# Patient Record
Sex: Male | Born: 1985 | Race: Black or African American | Hispanic: No | Marital: Married | Smoking: Never smoker
Health system: Southern US, Community
[De-identification: ages and names within clinical notes are randomized; demographics above are authoritative.]

## PROBLEM LIST (undated history)

## (undated) HISTORY — PX: SYNOVIAL CYST EXCISION: SUR507

---

## 2004-06-15 ENCOUNTER — Emergency Department (HOSPITAL_COMMUNITY): Admission: EM | Admit: 2004-06-15 | Discharge: 2004-06-15 | Payer: Self-pay | Admitting: Emergency Medicine

## 2010-01-23 ENCOUNTER — Encounter: Payer: Self-pay | Admitting: Family Medicine

## 2014-03-10 ENCOUNTER — Emergency Department (HOSPITAL_COMMUNITY)
Admission: EM | Admit: 2014-03-10 | Discharge: 2014-03-10 | Disposition: A | Discharge status: Home / Self Care | Payer: Managed Care, Other (non HMO) | Attending: Emergency Medicine | Admitting: Emergency Medicine

## 2014-03-10 ENCOUNTER — Encounter (HOSPITAL_COMMUNITY): Payer: Self-pay | Admitting: *Deleted

## 2014-03-10 DIAGNOSIS — R3915 Urgency of urination: Secondary | ICD-10-CM | POA: Diagnosis not present

## 2014-03-10 DIAGNOSIS — R350 Frequency of micturition: Secondary | ICD-10-CM | POA: Insufficient documentation

## 2014-03-10 LAB — URINALYSIS, ROUTINE W REFLEX MICROSCOPIC
Bilirubin Urine: NEGATIVE
Glucose, UA: NEGATIVE mg/dL
Hgb urine dipstick: NEGATIVE
Ketones, ur: NEGATIVE mg/dL
Leukocytes, UA: NEGATIVE
Nitrite: NEGATIVE
Protein, ur: NEGATIVE mg/dL
Specific Gravity, Urine: 1.026 (ref 1.005–1.030)
Urobilinogen, UA: 0.2 mg/dL (ref 0.0–1.0)
pH: 6.5 (ref 5.0–8.0)

## 2014-03-10 LAB — CBG MONITORING, ED: Glucose-Capillary: 87 mg/dL (ref 70–99)

## 2014-03-10 NOTE — ED Notes (Signed)
One and half months ago started having frequent urination.  Pt states he has pain post void.  No penile discharge.  Pt denies thirst.

## 2014-03-10 NOTE — Discharge Instructions (Signed)

## 2014-03-10 NOTE — ED Provider Notes (Signed)
CSN: [id number]     Arrival date & time 03/10/14  1359 History   First MD Initiated Contact with Patient 03/10/14 1524     Chief Complaint  Patient presents with  . Urinary Frequency     (Consider location/radiation/quality/duration/timing/severity/associated sxs/prior Treatment) HPI Comments: Patient presents today with complaints of urinary urgency and frequency.  He states that symptoms have been present for the past 1.5 months and is worsening.  He denies dysuria, penile discharge, scrotal pain/swelling, pain with defecation, fever,chills, abdominal pain, nausea, or vomiting.  No penile lesions.  He states that he is sexually active, but only with his wife.  He has not taken anything for his symptoms prior to arrival.  No history of DM.    Patient is a 29 y.o. male presenting with frequency. The history is provided by the patient.  Urinary Frequency    History reviewed. No pertinent past medical history. History reviewed. No pertinent past surgical history. No family history on file. History  Substance Use Topics  . Smoking status: Never Smoker   . Smokeless tobacco: Not on file  . Alcohol Use: No    Review of Systems  Genitourinary: Positive for frequency.  All other systems reviewed and are negative.     Allergies  Review of patient's allergies indicates no known allergies.  Home Medications   Prior to Admission medications   Medication Sig Start Date End Date Taking? Authorizing Provider  ibuprofen (ADVIL,MOTRIN) 200 MG tablet Take 800 mg by mouth daily as needed for mild pain or moderate pain.   Yes Historical Provider, MD   BP 119/72 mmHg  Pulse 50  Temp(Src) 97.9 F (36.6 C) (Oral)  Resp 18  SpO2 99% Physical Exam  Constitutional: He appears well-developed and well-nourished.  HENT:  Head: Normocephalic and atraumatic.  Mouth/Throat: Oropharynx is clear and moist.  Neck: Normal range of motion. Neck supple.  Cardiovascular: Normal rate, regular rhythm  and normal heart sounds.   Pulmonary/Chest: Effort normal and breath sounds normal.  Abdominal: Soft. Bowel sounds are normal. He exhibits no distension and no mass. There is no tenderness. There is no rebound and no guarding.  Genitourinary: Testes normal and penis normal. Prostate is not enlarged and not tender. Right testis shows no mass, no swelling and no tenderness. Left testis shows no mass, no swelling and no tenderness. No penile erythema. No discharge found.  Musculoskeletal: Normal range of motion.  Lymphadenopathy:       Right: No inguinal adenopathy present.       Left: No inguinal adenopathy present.  Neurological: He is alert.  Skin: Skin is warm and dry.  Psychiatric: He has a normal mood and affect.  Nursing note and vitals reviewed.   ED Course  Procedures (including critical care time) Labs Review Labs Reviewed  URINALYSIS, ROUTINE W REFLEX MICROSCOPIC  CBG MONITORING, ED  GC/CHLAMYDIA PROBE AMP ()    Imaging Review No results found.   EKG Interpretation None      MDM   Final diagnoses:  None   Patient is a 29 year old male who presents today with Urinary frequency and urgency.  UA is negative for infection.  CBG is 87.  No prostate tenderness on exam.  Bladder scan ordered.  However, bladder scanner needed to be charged and patient unable to wait for it to be charged because he had to pick up his children.  No obvious signs of urinary retention on exam.  Patient denies any pain and states  that he feels like he is emptying his bladder.  Patient stable for discharge.  Patient given referral to Urology.  Return precautions given.      Santiago GladHeather Colson Barco, PA-C 03/11/14 [id number]  Derwood KaplanAnkit Nanavati, MD 03/12/14 [phone]

## 2014-03-10 NOTE — ED Notes (Signed)
Bladder scanner not available. Pt sts he does not think he is retaining any urine.

## 2014-03-11 LAB — GC/CHLAMYDIA PROBE AMP (~~LOC~~) NOT AT ARMC
Chlamydia: NEGATIVE
Neisseria Gonorrhea: NEGATIVE

## 2014-04-09 ENCOUNTER — Emergency Department (HOSPITAL_COMMUNITY)
Admission: EM | Admit: 2014-04-09 | Discharge: 2014-04-09 | Disposition: A | Discharge status: Home / Self Care | Payer: Worker's Compensation | Attending: Emergency Medicine | Admitting: Emergency Medicine

## 2014-04-09 ENCOUNTER — Encounter (HOSPITAL_COMMUNITY): Payer: Self-pay | Admitting: Emergency Medicine

## 2014-04-09 ENCOUNTER — Emergency Department (HOSPITAL_COMMUNITY): Payer: Worker's Compensation

## 2014-04-09 DIAGNOSIS — M545 Low back pain, unspecified: Secondary | ICD-10-CM

## 2014-04-09 DIAGNOSIS — R1030 Lower abdominal pain, unspecified: Secondary | ICD-10-CM

## 2014-04-09 DIAGNOSIS — R110 Nausea: Secondary | ICD-10-CM | POA: Diagnosis not present

## 2014-04-09 DIAGNOSIS — R350 Frequency of micturition: Secondary | ICD-10-CM

## 2014-04-09 LAB — URINALYSIS, ROUTINE W REFLEX MICROSCOPIC
Bilirubin Urine: NEGATIVE
Glucose, UA: NEGATIVE mg/dL
Hgb urine dipstick: NEGATIVE
Ketones, ur: NEGATIVE mg/dL
Leukocytes, UA: NEGATIVE
Nitrite: NEGATIVE
Protein, ur: NEGATIVE mg/dL
Specific Gravity, Urine: 1.014 (ref 1.005–1.030)
Urobilinogen, UA: 0.2 mg/dL (ref 0.0–1.0)
pH: 6 (ref 5.0–8.0)

## 2014-04-09 NOTE — ED Provider Notes (Signed)
CSN: [id number]     Arrival date & time 04/09/14  1055 History  This chart was scribed for a non-physician practitioner, Kathrynn Speedobyn M Alane Hanssen, PA-C working with Shon Batonourtney F Horton, MD by [place] Peace, ED Scribe. The patient was seen in TR02C/TR02C. The patient's care was started at 1:27 PM.    Chief Complaint  Patient presents with  . Back Pain      Patient is a 29 y.o. male presenting with back pain. The history is provided by the patient. No language interpreter was used.  Back Pain Associated symptoms: no fever   HPI Comments: Jeremy Hamilton is a 29 y.o. male who presents to the Emergency Department complaining of lower back pain x 3 weeks that occurred while pt was at work and went to pick up a box and states he experienced a sharp pain. 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 He reports pain radiates from lower back into his groin. He also complains of increased frequency and urgency in urination and intermittent nausea. No complaints of penile discharge, fever, or vomiting. Pt further states initially he was sent to an urgent care after incident and had x-rays and lumbar spine MRI performed that came back negative. He was given pain medication as well but denies much improvement in symptoms. Denies hx of kidney stones.   History reviewed. No pertinent past medical history. History reviewed. No pertinent past surgical history. No family history on file. History  Substance Use Topics  . Smoking status: Never Smoker   . Smokeless tobacco: Not on file  . Alcohol Use: No    Review of Systems  Constitutional: Negative for fever.  Gastrointestinal: Positive for nausea. Negative for vomiting.  Genitourinary: Positive for urgency and frequency. Negative for discharge.  Musculoskeletal: Positive for back pain.  All other systems reviewed and are negative.     Allergies  Review of patient's allergies indicates no known allergies.  Home Medications   Prior to  Admission medications   Medication Sig Start Date End Date Taking? Authorizing Provider  ibuprofen (ADVIL,MOTRIN) 200 MG tablet Take 800 mg by mouth daily as needed for mild pain or moderate pain.    Historical Provider, MD   BP 136/87 mmHg  Pulse 68  Temp(Src) 98.2 F (36.8 C) (Oral)  Resp 18  SpO2 100% Physical Exam  Constitutional: He is oriented to person, place, and time. He appears well-developed and well-nourished. No distress.  HENT:  Head: Normocephalic and atraumatic.  Mouth/Throat: Oropharynx is clear and moist.  Eyes: Conjunctivae are normal.  Neck: Normal range of motion. Neck supple. No spinous process tenderness and no muscular tenderness present.  Cardiovascular: Normal rate, regular rhythm and normal heart sounds.   Pulmonary/Chest: Effort normal and breath sounds normal. No respiratory distress.  Abdominal: Soft. Bowel sounds are normal. He exhibits no distension. There is no tenderness. Hernia confirmed negative in the right inguinal area and confirmed negative in the left inguinal area.  BL CVAT.  Genitourinary: Testes normal. Right testis shows no mass, no swelling and no tenderness. Left testis shows no mass, no swelling and no tenderness.  No hernias palpated, however when assessing for right-sided inguinal hernia, increased pain present.  Musculoskeletal: He exhibits no edema.  TTP over BL PSIS and right lumbar paraspinal muscles.  Neurological: He is alert and oriented to person, place, and time. He has normal strength.  Strength lower extremities 5/5 and equal bilateral. Sensation intact. Normal gait.  Skin: Skin is warm and  dry. No rash noted. He is not diaphoretic.  Psychiatric: He has a normal mood and affect. His behavior is normal.  Nursing note and vitals reviewed.   ED Course  Procedures (including critical care time) Labs Review Labs Reviewed  URINALYSIS, ROUTINE W REFLEX MICROSCOPIC    Imaging Review Ct Abdomen Pelvis Wo  Contrast  04/09/2014   CLINICAL DATA:  Groin pain, low back pain, works lifting heavy boxes  EXAM: CT ABDOMEN AND PELVIS WITHOUT CONTRAST  TECHNIQUE: Multidetector CT imaging of the abdomen and pelvis was performed following the standard protocol without IV contrast.  COMPARISON:  None.  FINDINGS: Lung bases are unremarkable. Sagittal images of thoracic and lumbar spine are unremarkable. No acute fractures are noted. No sacral fracture is noted.  Unenhanced liver shows no biliary ductal dilatation. No calcified gallstones are noted within gallbladder. Unenhanced pancreas, spleen and adrenal glands are unremarkable. Small accessory splenule. Unenhanced kidneys are symmetrical in size. No aortic aneurysm. No nephrolithiasis. No calcified ureteral calculi are noted bilaterally.  No small bowel obstruction. No ascites or free air. No adenopathy. No pericecal inflammation. Normal appendix. No distal colonic obstruction. No calcified calculi are noted within urinary bladder. Bilateral distal ureter is unremarkable. Prostate gland and seminal vesicles are unremarkable. Bilateral SI joints are unremarkable. No pelvic fractures are noted.  IMPRESSION: 1. No nephrolithiasis.  No hydronephrosis or hydroureter. 2. No acute fractures are noted. 3. No calcified ureteral calculi. 4. Normal appendix.  No pericecal inflammation. 5. No small bowel obstruction.   Electronically Signed   By: Natasha Mead M.D.   On: 04/09/2014 15:17     EKG Interpretation None     Medications - No data to display  1:32 PM- Treatment plan was discussed with patient who verbalizes understanding and agrees.   MDM   Final diagnoses:  Groin pain  Bilateral low back pain without sciatica  Urinary frequency   Nontoxic appearing, NAD. AFVSS. No red flags concerning patient's back pain. No s/s of central cord compression or cauda equina. Lower extremities are neurovascularly intact and patient is ambulating without difficulty. CT without contrast  obtained to evaluate for possible kidney stone given patient's symptoms, negative for any acute finding. Urinalysis negative. No testicular pain or swelling. The groin pain is reproduced only when assessing for hernia. This is possibly a groin strain from lifting. I advised him to continue taking the medications that he has at home and follow-up with his PCP. Resources given for orthopedic follow-up if no improvement. Stable for discharge. Return precautions given. Patient states understanding of treatment care plan and is agreeable.  I personally performed the services described in this documentation, which was scribed in my presence. The recorded information has been reviewed and is accurate.   Kathrynn Speed, PA-C 04/09/14 1531  Shon Baton, MD 04/09/14 [phone]

## 2014-04-09 NOTE — ED Notes (Signed)
Pt reports injuring lower back by lifting something heavy at work 2 weeks ago- for the past 5 days pain has radiated from the lower back into groin- admits to increased frequency in urination- denies incontinence.

## 2014-04-09 NOTE — Discharge Instructions (Signed)
Continue taking the medications that you have at home for your pain. Follow-up with your primary care physician. Follow-up at [facility] if your symptoms do not improve.  Back Pain, Adult Low back pain is very common. About 1 in 5 people have back pain.The cause of low back pain is rarely dangerous. The pain often gets better over time.About half of people with a sudden onset of back pain feel better in just 2 weeks. About 8 in 10 people feel better by 6 weeks.  CAUSES Some common causes of back pain include:  Strain of the muscles or ligaments supporting the spine.  Wear and tear (degeneration) of the spinal discs.  Arthritis.  Direct injury to the back. DIAGNOSIS Most of the time, the direct cause of low back pain is not known.However, back pain can be treated effectively even when the exact cause of the pain is unknown.Answering your caregiver's questions about your overall health and symptoms is one of the most accurate ways to make sure the cause of your pain is not dangerous. If your caregiver needs more information, he or she may order lab work or imaging tests (X-rays or MRIs).However, even if imaging tests show changes in your back, this usually does not require surgery. HOME CARE INSTRUCTIONS For many people, back pain returns.Since low back pain is rarely dangerous, it is often a condition that people can learn to [facility] their own.   Remain active. It is stressful on the back to sit or stand in one place. Do not sit, drive, or stand in one place for more than 30 minutes at a time. Take short walks on level surfaces as soon as pain allows.Try to increase the length of time you walk each day.  Do not stay in bed.Resting more than 1 or 2 days can delay your recovery.  Do not avoid exercise or work.Your body is made to move.It is not dangerous to be active, even though your back may hurt.Your back will likely heal faster if you return to being active before your  pain is gone.  Pay attention to your body when you bend and lift. Many people have less discomfortwhen lifting if they bend their knees, keep the load close to their bodies,and avoid twisting. Often, the most comfortable positions are those that put less stress on your recovering back.  Find a comfortable position to sleep. Use a firm mattress and lie on your side with your knees slightly bent. If you lie on your back, put a pillow under your knees.  Only take over-the-counter or prescription medicines as directed by your caregiver. Over-the-counter medicines to reduce pain and inflammation are often the most helpful.Your caregiver may prescribe muscle relaxant drugs.These medicines help dull your pain so you can more quickly return to your normal activities and healthy exercise.  Put ice on the injured area.  Put ice in a plastic bag.  Place a towel between your skin and the bag.  Leave the ice on for 15-20 minutes, 03-04 times a day for the first 2 to 3 days. After that, ice and heat may be alternated to reduce pain and spasms.  Ask your caregiver about trying back exercises and gentle massage. This may be of some benefit.  Avoid feeling anxious or stressed.Stress increases muscle tension and can worsen back pain.It is important to recognize when you are anxious or stressed and learn ways to manage it.Exercise is a great option. SEEK MEDICAL CARE IF:  You have pain that is not  relieved with rest or medicine.  You have pain that does not improve in 1 week.  You have new symptoms.  You are generally not feeling well. SEEK IMMEDIATE MEDICAL CARE IF:   You have pain that radiates from your back into your legs.  You develop new bowel or bladder control problems.  You have unusual weakness or numbness in your arms or legs.  You develop nausea or vomiting.  You develop abdominal pain.  You feel faint. Document Released: 12/19/2004 Document Revised: 06/20/2011 Document  Reviewed: 04/22/2013 [facility] Patient Information 2015 [place], [place]. This information is not intended to replace advice given to you by your health care provider. Make sure you discuss any questions you have with your health care provider. Urinary Frequency The number of times a normal person urinates depends upon how much liquid they take in and how much liquid they are losing. If the temperature is hot and there is high humidity, then the person will sweat more and usually breathe a little more frequently. These factors decrease the amount of frequency of urination that would be considered normal. The amount you drink is easily determined, but the amount of fluid lost is sometimes more difficult to calculate.  Fluid is lost in two ways:  Sensible fluid loss is usually measured by the amount of urine that you get rid of. Losses of fluid can also occur with diarrhea.  Insensible fluid loss is more difficult to measure. It is caused by evaporation. Insensible loss of fluid occurs through breathing and sweating. It usually ranges from a little less than a quart to a little more than a quart of fluid a day. In normal temperatures and activity levels, the average person may urinate 4 to 7 times in a 24-hour period. Needing to urinate more often than that could indicate a problem. If one urinates 4 to 7 times in 24 hours and has large volumes each time, that could indicate a different problem from one who urinates 4 to 7 times a day and has small volumes. The time of urinating is also important. Most urinating should be done during the waking hours. Getting up at night to urinate frequently can indicate some problems. CAUSES  The bladder is the organ in your lower abdomen that holds urine. Like a balloon, it swells some as it fills up. Your nerves sense this and tell you it is time to head for the bathroom. There are a number of reasons that you might feel the need to urinate more often than usual. They  include:  Urinary tract infection. This is usually associated with other signs such as burning when you urinate.  In men, problems with the prostate (a walnut-size gland that is located near the tube that carries urine out of your body). There are two reasons why the prostate can cause an increased frequency of urination:  An enlarged prostate that does not let the bladder empty well. If the bladder only half empties when you urinate, then it only has half the capacity to fill before you have to urinate again.  The nerves in the bladder become more hypersensitive with an increased size of the prostate even if the bladder empties completely.  Pregnancy.  Obesity. Excess weight is more likely to cause a problem for women than for men.  Bladder stones or other bladder problems.  Caffeine.  Alcohol.  Medications. For example, drugs that help the body get rid of extra fluid (diuretics) increase urine production. Some other medicines must  be taken with lots of fluids.  Muscle or nerve weakness. This might be the result of a spinal cord injury, a stroke, multiple sclerosis, or Parkinson disease.  Long-standing diabetes can decrease the sensation of the bladder. This loss of sensation makes it harder to sense the bladder needs to be emptied. Over a period of years, the bladder is stretched out by constant overfilling. This weakens the bladder muscles so that the bladder does not empty well and has less capacity to fill with new urine.  Interstitial cystitis (also called painful bladder syndrome). This condition develops because the tissues that line the inside of the bladder are inflamed (inflammation is the body's way of reacting to injury or infection). It causes pain and frequent urination. It occurs in women more often than in men. DIAGNOSIS   To decide what might be causing your urinary frequency, your health care provider will probably:  Ask about symptoms you have noticed.  Ask about  your overall health. This will include questions about any medications you are taking.  Do a physical examination.  Order some tests. These might include:  A blood test to check for diabetes or other health issues that could be contributing to the problem.  Urine testing. This could measure the flow of urine and the pressure on the bladder.  A test of your neurological system (the brain, spinal cord, and nerves). This is the system that senses the need to urinate.  A bladder test to check whether it is emptying completely when you urinate.  Cystoscopy. This test uses a thin tube with a tiny camera on it. It offers a look inside your urethra and bladder to see if there are problems.  Imaging tests. You might be given a contrast dye and then asked to urinate. X-rays are taken to see how your bladder is working. TREATMENT  It is important for you to be evaluated to determine if the amount or frequency that you have is unusual or abnormal. If it is found to be abnormal, the cause should be determined and this can usually be found out easily. Depending upon the cause, treatment could include medication, stimulation of the nerves, or surgery. There are not too many things that you can do as an individual to change your urinary frequency. It is important that you balance the amount of fluid intake needed to compensate for your activity and the temperature. Medical problems will be diagnosed and taken care of by your physician. There is no particular bladder training such as Kegel exercises that you can do to help urinary frequency. This is an exercise that is usually recommended for people who have leaking of urine when they laugh, cough, or sneeze. HOME CARE INSTRUCTIONS   Take any medications your health care provider prescribed or suggested. Follow the directions carefully.  Practice any lifestyle changes that are recommended. These might include:  Drinking less fluid or drinking at different  times of the day. If you need to urinate often during the night, for example, you may need to stop drinking fluids early in the evening.  Cutting down on caffeine or alcohol. They both can make you need to urinate more often than normal. Caffeine is found in coffee, tea, and sodas.  Losing weight, if that is recommended.  Keep a journal or a log. You might be asked to record how much you drink and when and where you feel the need to urinate. This will also help evaluate how well the treatment provided  by your physician is working. SEEK MEDICAL CARE IF:   Your need to urinate often gets worse.  You feel increased pain or irritation when you urinate.  You notice blood in your urine.  You have questions about any medications that your health care provider recommended.  You notice blood, pus, or swelling at the site of any test or treatment procedure.  You develop a fever of more than 100.727F (38.1C). SEEK IMMEDIATE MEDICAL CARE IF:  You develop a fever of more than 102.27F (38.9C). Document Released: 10/15/2008 Document Revised: 05/05/2013 Document Reviewed: 10/15/2008 [facility] Patient Information 2015 [place], [place]. This information is not intended to replace advice given to you by your health care provider. Make sure you discuss any questions you have with your health care provider.

## 2014-10-08 ENCOUNTER — Other Ambulatory Visit (HOSPITAL_COMMUNITY): Payer: Self-pay | Admitting: Orthopaedic Surgery

## 2014-10-08 DIAGNOSIS — R102 Pelvic and perineal pain: Secondary | ICD-10-CM

## 2014-10-13 ENCOUNTER — Encounter (HOSPITAL_COMMUNITY): Payer: Worker's Compensation

## 2014-10-13 ENCOUNTER — Encounter (HOSPITAL_COMMUNITY): Admission: RE | Admit: 2014-10-13 | Payer: Worker's Compensation | Source: Ambulatory Visit

## 2014-10-22 ENCOUNTER — Other Ambulatory Visit (HOSPITAL_COMMUNITY): Payer: Self-pay | Admitting: Orthopaedic Surgery

## 2014-10-22 DIAGNOSIS — R102 Pelvic and perineal pain: Secondary | ICD-10-CM

## 2014-11-02 ENCOUNTER — Encounter (HOSPITAL_COMMUNITY): Payer: Self-pay

## 2014-11-02 ENCOUNTER — Ambulatory Visit (HOSPITAL_COMMUNITY)
Admission: RE | Admit: 2014-11-02 | Discharge: 2014-11-02 | Disposition: A | Discharge status: Home / Self Care | Payer: Worker's Compensation | Source: Ambulatory Visit | Attending: Orthopaedic Surgery | Admitting: Orthopaedic Surgery

## 2014-11-02 ENCOUNTER — Encounter (HOSPITAL_COMMUNITY)
Admission: RE | Admit: 2014-11-02 | Discharge: 2014-11-02 | Disposition: A | Discharge status: Home / Self Care | Payer: Worker's Compensation | Source: Ambulatory Visit | Attending: Orthopaedic Surgery | Admitting: Orthopaedic Surgery

## 2014-11-02 DIAGNOSIS — R102 Pelvic and perineal pain: Secondary | ICD-10-CM

## 2014-11-02 MED ORDER — TECHNETIUM TC 99M MEDRONATE IV KIT
25.0000 | PACK | Freq: Once | INTRAVENOUS | Status: AC | PRN
  Administered 2014-11-02: 25 via INTRAVENOUS

## 2014-12-23 ENCOUNTER — Ambulatory Visit (INDEPENDENT_AMBULATORY_CARE_PROVIDER_SITE_OTHER): Payer: BLUE CROSS/BLUE SHIELD | Admitting: Physician Assistant

## 2014-12-23 VITALS — BP 120/82 | HR 86 | Temp 98.7°F | Resp 16 | Ht 68.0 in | Wt 180.0 lb

## 2014-12-23 DIAGNOSIS — M869 Osteomyelitis, unspecified: Secondary | ICD-10-CM | POA: Diagnosis not present

## 2014-12-23 NOTE — Progress Notes (Signed)
[facility] [address], [place] [address] [phone]  Date:  12/23/2014   Name:  Jeremy Hamilton   DOB:  1985/01/18   MRN:  [id number]  PCP:  No PCP Per Patient    Chief Complaint: Back Injury and Groin Injury   History of Present Illness:  This is a 29 y.o. male who is presenting with groin and back pain since march 2016 after injury at work. He was lifting a package and twisted to put it in a truck when he felt sudden back pain that radiated into his groin. He was seen initially at [facility] urgent [facility]. He was sent for MRI of spine which was negative. 3 weeks later he was seen in ED. CT abdomen negative. UA negative as well. He was then referred to Dr. Noel Geroldohen at [facility]. There he had an MRI of his pelvis that showed inflammation around pubic symphysis - dx'd with osteitis pubis. He did 10 sessions of PT and two injections into pubic symphysis and no help. Dr. Noel Geroldohen mentioned surgery but states it is risky and may not improve his pain. He reports nothing has changed in his symptoms. He states he is suffering every day. Pain in his groin is constant. Adducting and flexing hip hurt the most. Pain in back is intermittent - worse with prolonged standing/walking and twisting of trunk. Has bilateral ankle swelling in the mornings that gets better as the day goes on. He states he was a very active person before this began and now he is unable to do much d/t pain. He has not worked since the injury. He is prescribed tramadol - he states this helps sometimes. He is wondering if there is anyone else I can refer him to for a second opinion.  Review of Systems:  Review of Systems See HPI  There are no active problems to display for this patient.   Prior to Admission medications   Medication Sig Start Date End Date Taking? Authorizing Provider  ibuprofen (ADVIL,MOTRIN) 200 MG tablet Take 800 mg by mouth daily as needed for mild pain or moderate  pain.   Yes Historical Provider, MD  traMADol (ULTRAM) 50 MG tablet Take 50 mg by mouth every 6 (six) hours as needed.   Yes Historical Provider, MD    No Known Allergies  History reviewed. No pertinent past surgical history.  Social History  Substance Use Topics  . Smoking status: Never Smoker   . Smokeless tobacco: None  . Alcohol Use: No    History reviewed. No pertinent family history.  Medication list has been reviewed and updated.  Physical Examination:  Physical Exam  Constitutional: He is oriented to person, place, and time. He appears well-developed and well-nourished. No distress.  HENT:  Head: Normocephalic and atraumatic.  Right Ear: Hearing normal.  Left Ear: Hearing normal.  Nose: Nose normal.  Eyes: Conjunctivae and lids are normal. Right eye exhibits no discharge. Left eye exhibits no discharge. No scleral icterus.  Cardiovascular: Normal rate, regular rhythm, normal heart sounds and normal pulses.   No murmur heard. Pulmonary/Chest: Effort normal and breath sounds normal. No respiratory distress. He has no wheezes. He has no rhonchi. He has no rales.  Abdominal: Soft. Normal appearance. There is tenderness.    Pain in suprapubic region. More pain lateral to pubic symphysis over soft tissue rather than over bone  Musculoskeletal:       Right hip: He exhibits decreased range of  motion (decreased flexion and adduction), decreased strength (3/5 with hip flexion and hip adduction) and tenderness (with ROM).       Left hip: He exhibits decreased range of motion (flexion and adduciton), decreased strength (3/5 with hip flexion, 3/5 hip adduction) and tenderness.       Lumbar back: He exhibits normal range of motion, no tenderness and no bony tenderness.  Full strength with hip extension and hip abduction   Neurological: He is alert and oriented to person, place, and time. He has normal reflexes. No sensory deficit. Gait (antalgic) abnormal.  Skin: Skin is warm,  dry and intact. No lesion and no rash noted.  No LE edema Pedal pulses intact  Psychiatric: He has a normal mood and affect. His speech is normal and behavior is normal. Thought content normal.   BP 120/82 mmHg  Pulse 86  Temp(Src) 98.7 F (37.1 C) (Oral)  Resp 16  Ht  (1.727 m)  Wt 180 lb (81.647 kg)  BMI 27.38 kg/m2  SpO2 98%  Assessment and Plan:  1. Osteitis pubis (HCC) Pt wants 2nd opinion. Referred to Dr. Trudee Grip. I am hopeful he will start in physical therapy program again. I am worried for pt that this will become a chronic issue since pain began 9 months ago and he has so far failed medical and physical therapy. He is very weak with hip flexion and adduction - I think he would benefit from more PT. I also referred to acupuncture as adjunctive therapy. - Ambulatory referral to Orthopedic Surgery - Ambulatory referral to Integrative Medicine   Roswell Miners. Dyke Brackett, MHS [facility]  12/23/2014

## 2014-12-23 NOTE — Patient Instructions (Signed)
You will get a phone call to make appointment at [facility]. They can set you up with physical therapy. Make sure to bring copies of your MRI and other imaging studies. You will get a phone call to make appointment for acupuncture.

## 2018-03-14 DIAGNOSIS — R5383 Other fatigue: Secondary | ICD-10-CM | POA: Diagnosis not present

## 2018-03-14 DIAGNOSIS — Z6827 Body mass index (BMI) 27.0-27.9, adult: Secondary | ICD-10-CM | POA: Diagnosis not present

## 2018-03-14 DIAGNOSIS — R05 Cough: Secondary | ICD-10-CM | POA: Diagnosis not present

## 2018-03-14 DIAGNOSIS — J069 Acute upper respiratory infection, unspecified: Secondary | ICD-10-CM | POA: Diagnosis not present

## 2018-03-18 DIAGNOSIS — Z6827 Body mass index (BMI) 27.0-27.9, adult: Secondary | ICD-10-CM | POA: Diagnosis not present

## 2018-03-18 DIAGNOSIS — R0600 Dyspnea, unspecified: Secondary | ICD-10-CM | POA: Diagnosis not present

## 2018-03-18 DIAGNOSIS — R05 Cough: Secondary | ICD-10-CM | POA: Diagnosis not present

## 2018-03-18 DIAGNOSIS — J069 Acute upper respiratory infection, unspecified: Secondary | ICD-10-CM | POA: Diagnosis not present

## 2018-06-18 DIAGNOSIS — M7731 Calcaneal spur, right foot: Secondary | ICD-10-CM | POA: Diagnosis not present

## 2018-06-18 DIAGNOSIS — M722 Plantar fascial fibromatosis: Secondary | ICD-10-CM | POA: Diagnosis not present

## 2018-06-18 DIAGNOSIS — M71572 Other bursitis, not elsewhere classified, left ankle and foot: Secondary | ICD-10-CM | POA: Diagnosis not present

## 2018-06-18 DIAGNOSIS — M7732 Calcaneal spur, left foot: Secondary | ICD-10-CM | POA: Diagnosis not present

## 2018-06-18 DIAGNOSIS — M71571 Other bursitis, not elsewhere classified, right ankle and foot: Secondary | ICD-10-CM | POA: Diagnosis not present

## 2018-06-24 DIAGNOSIS — M722 Plantar fascial fibromatosis: Secondary | ICD-10-CM | POA: Diagnosis not present

## 2018-06-24 DIAGNOSIS — M71579 Other bursitis, not elsewhere classified, unspecified ankle and foot: Secondary | ICD-10-CM | POA: Diagnosis not present

## 2018-07-01 DIAGNOSIS — M722 Plantar fascial fibromatosis: Secondary | ICD-10-CM | POA: Diagnosis not present

## 2018-07-01 DIAGNOSIS — M71572 Other bursitis, not elsewhere classified, left ankle and foot: Secondary | ICD-10-CM | POA: Diagnosis not present

## 2018-07-01 DIAGNOSIS — M71571 Other bursitis, not elsewhere classified, right ankle and foot: Secondary | ICD-10-CM | POA: Diagnosis not present

## 2018-07-08 DIAGNOSIS — M722 Plantar fascial fibromatosis: Secondary | ICD-10-CM | POA: Diagnosis not present

## 2018-07-08 DIAGNOSIS — M71571 Other bursitis, not elsewhere classified, right ankle and foot: Secondary | ICD-10-CM | POA: Diagnosis not present

## 2018-07-08 DIAGNOSIS — M71572 Other bursitis, not elsewhere classified, left ankle and foot: Secondary | ICD-10-CM | POA: Diagnosis not present

## 2018-07-09 DIAGNOSIS — G5601 Carpal tunnel syndrome, right upper limb: Secondary | ICD-10-CM | POA: Diagnosis not present

## 2018-07-09 DIAGNOSIS — M5417 Radiculopathy, lumbosacral region: Secondary | ICD-10-CM | POA: Diagnosis not present

## 2018-07-09 DIAGNOSIS — M79672 Pain in left foot: Secondary | ICD-10-CM | POA: Diagnosis not present

## 2018-07-09 DIAGNOSIS — G43009 Migraine without aura, not intractable, without status migrainosus: Secondary | ICD-10-CM | POA: Diagnosis not present

## 2018-07-09 DIAGNOSIS — M79671 Pain in right foot: Secondary | ICD-10-CM | POA: Diagnosis not present

## 2018-07-18 DIAGNOSIS — G5601 Carpal tunnel syndrome, right upper limb: Secondary | ICD-10-CM | POA: Diagnosis not present

## 2018-07-18 DIAGNOSIS — G43009 Migraine without aura, not intractable, without status migrainosus: Secondary | ICD-10-CM | POA: Diagnosis not present

## 2018-07-18 DIAGNOSIS — M71572 Other bursitis, not elsewhere classified, left ankle and foot: Secondary | ICD-10-CM | POA: Diagnosis not present

## 2018-07-18 DIAGNOSIS — M71571 Other bursitis, not elsewhere classified, right ankle and foot: Secondary | ICD-10-CM | POA: Diagnosis not present

## 2018-07-18 DIAGNOSIS — M5417 Radiculopathy, lumbosacral region: Secondary | ICD-10-CM | POA: Diagnosis not present

## 2018-07-18 DIAGNOSIS — M722 Plantar fascial fibromatosis: Secondary | ICD-10-CM | POA: Diagnosis not present

## 2018-11-05 DIAGNOSIS — Z20828 Contact with and (suspected) exposure to other viral communicable diseases: Secondary | ICD-10-CM | POA: Diagnosis not present

## 2018-11-16 ENCOUNTER — Other Ambulatory Visit: Payer: Self-pay | Admitting: *Deleted

## 2018-11-16 DIAGNOSIS — Z20822 Contact with and (suspected) exposure to covid-19: Secondary | ICD-10-CM

## 2018-11-16 DIAGNOSIS — Z20828 Contact with and (suspected) exposure to other viral communicable diseases: Secondary | ICD-10-CM

## 2018-11-19 LAB — NOVEL CORONAVIRUS, NAA: SARS-CoV-2, NAA: NOT DETECTED

## 2018-12-06 DIAGNOSIS — M71571 Other bursitis, not elsewhere classified, right ankle and foot: Secondary | ICD-10-CM | POA: Diagnosis not present

## 2018-12-06 DIAGNOSIS — M71572 Other bursitis, not elsewhere classified, left ankle and foot: Secondary | ICD-10-CM | POA: Diagnosis not present

## 2018-12-06 DIAGNOSIS — M722 Plantar fascial fibromatosis: Secondary | ICD-10-CM | POA: Diagnosis not present

## 2018-12-12 DIAGNOSIS — M79671 Pain in right foot: Secondary | ICD-10-CM | POA: Diagnosis not present

## 2018-12-12 DIAGNOSIS — M5417 Radiculopathy, lumbosacral region: Secondary | ICD-10-CM | POA: Diagnosis not present

## 2018-12-12 DIAGNOSIS — G43009 Migraine without aura, not intractable, without status migrainosus: Secondary | ICD-10-CM | POA: Diagnosis not present

## 2018-12-12 DIAGNOSIS — M79672 Pain in left foot: Secondary | ICD-10-CM | POA: Diagnosis not present

## 2018-12-19 ENCOUNTER — Other Ambulatory Visit: Payer: Self-pay | Admitting: Specialist

## 2018-12-19 DIAGNOSIS — M5417 Radiculopathy, lumbosacral region: Secondary | ICD-10-CM

## 2018-12-20 DIAGNOSIS — R262 Difficulty in walking, not elsewhere classified: Secondary | ICD-10-CM | POA: Diagnosis not present

## 2018-12-20 DIAGNOSIS — M5431 Sciatica, right side: Secondary | ICD-10-CM | POA: Diagnosis not present

## 2018-12-20 DIAGNOSIS — M6281 Muscle weakness (generalized): Secondary | ICD-10-CM | POA: Diagnosis not present

## 2018-12-25 DIAGNOSIS — M5431 Sciatica, right side: Secondary | ICD-10-CM | POA: Diagnosis not present

## 2018-12-25 DIAGNOSIS — M6281 Muscle weakness (generalized): Secondary | ICD-10-CM | POA: Diagnosis not present

## 2018-12-25 DIAGNOSIS — R262 Difficulty in walking, not elsewhere classified: Secondary | ICD-10-CM | POA: Diagnosis not present

## 2018-12-31 DIAGNOSIS — M5431 Sciatica, right side: Secondary | ICD-10-CM | POA: Diagnosis not present

## 2018-12-31 DIAGNOSIS — R262 Difficulty in walking, not elsewhere classified: Secondary | ICD-10-CM | POA: Diagnosis not present

## 2018-12-31 DIAGNOSIS — M6281 Muscle weakness (generalized): Secondary | ICD-10-CM | POA: Diagnosis not present

## 2019-01-02 DIAGNOSIS — M5431 Sciatica, right side: Secondary | ICD-10-CM | POA: Diagnosis not present

## 2019-01-02 DIAGNOSIS — M6281 Muscle weakness (generalized): Secondary | ICD-10-CM | POA: Diagnosis not present

## 2019-01-02 DIAGNOSIS — R262 Difficulty in walking, not elsewhere classified: Secondary | ICD-10-CM | POA: Diagnosis not present

## 2019-01-06 DIAGNOSIS — M6281 Muscle weakness (generalized): Secondary | ICD-10-CM | POA: Diagnosis not present

## 2019-01-06 DIAGNOSIS — R262 Difficulty in walking, not elsewhere classified: Secondary | ICD-10-CM | POA: Diagnosis not present

## 2019-01-06 DIAGNOSIS — M5431 Sciatica, right side: Secondary | ICD-10-CM | POA: Diagnosis not present

## 2019-01-08 DIAGNOSIS — M5431 Sciatica, right side: Secondary | ICD-10-CM | POA: Diagnosis not present

## 2019-01-08 DIAGNOSIS — R262 Difficulty in walking, not elsewhere classified: Secondary | ICD-10-CM | POA: Diagnosis not present

## 2019-01-08 DIAGNOSIS — M6281 Muscle weakness (generalized): Secondary | ICD-10-CM | POA: Diagnosis not present

## 2019-01-10 DIAGNOSIS — M6281 Muscle weakness (generalized): Secondary | ICD-10-CM | POA: Diagnosis not present

## 2019-01-10 DIAGNOSIS — R262 Difficulty in walking, not elsewhere classified: Secondary | ICD-10-CM | POA: Diagnosis not present

## 2019-01-10 DIAGNOSIS — M5431 Sciatica, right side: Secondary | ICD-10-CM | POA: Diagnosis not present

## 2019-01-13 ENCOUNTER — Other Ambulatory Visit: Payer: Self-pay

## 2019-01-13 ENCOUNTER — Ambulatory Visit
Admission: RE | Admit: 2019-01-13 | Discharge: 2019-01-13 | Disposition: A | Discharge status: Home / Self Care | Payer: Managed Care, Other (non HMO) | Source: Ambulatory Visit | Attending: Specialist | Admitting: Specialist

## 2019-01-13 DIAGNOSIS — M5417 Radiculopathy, lumbosacral region: Secondary | ICD-10-CM

## 2019-01-15 DIAGNOSIS — M5431 Sciatica, right side: Secondary | ICD-10-CM | POA: Diagnosis not present

## 2019-01-15 DIAGNOSIS — M6281 Muscle weakness (generalized): Secondary | ICD-10-CM | POA: Diagnosis not present

## 2019-01-15 DIAGNOSIS — R262 Difficulty in walking, not elsewhere classified: Secondary | ICD-10-CM | POA: Diagnosis not present

## 2019-01-17 DIAGNOSIS — M5431 Sciatica, right side: Secondary | ICD-10-CM | POA: Diagnosis not present

## 2019-01-17 DIAGNOSIS — R262 Difficulty in walking, not elsewhere classified: Secondary | ICD-10-CM | POA: Diagnosis not present

## 2019-01-17 DIAGNOSIS — M6281 Muscle weakness (generalized): Secondary | ICD-10-CM | POA: Diagnosis not present

## 2019-01-20 DIAGNOSIS — M5431 Sciatica, right side: Secondary | ICD-10-CM | POA: Diagnosis not present

## 2019-01-20 DIAGNOSIS — M6281 Muscle weakness (generalized): Secondary | ICD-10-CM | POA: Diagnosis not present

## 2019-01-20 DIAGNOSIS — R262 Difficulty in walking, not elsewhere classified: Secondary | ICD-10-CM | POA: Diagnosis not present

## 2019-01-22 DIAGNOSIS — G43009 Migraine without aura, not intractable, without status migrainosus: Secondary | ICD-10-CM | POA: Diagnosis not present

## 2019-01-22 DIAGNOSIS — G5601 Carpal tunnel syndrome, right upper limb: Secondary | ICD-10-CM | POA: Diagnosis not present

## 2019-01-22 DIAGNOSIS — M5417 Radiculopathy, lumbosacral region: Secondary | ICD-10-CM | POA: Diagnosis not present

## 2019-01-30 DIAGNOSIS — M5431 Sciatica, right side: Secondary | ICD-10-CM | POA: Diagnosis not present

## 2019-01-30 DIAGNOSIS — D179 Benign lipomatous neoplasm, unspecified: Secondary | ICD-10-CM | POA: Diagnosis not present

## 2019-02-06 DIAGNOSIS — M5416 Radiculopathy, lumbar region: Secondary | ICD-10-CM | POA: Diagnosis not present

## 2019-02-06 DIAGNOSIS — Z6828 Body mass index (BMI) 28.0-28.9, adult: Secondary | ICD-10-CM | POA: Diagnosis not present

## 2019-02-06 DIAGNOSIS — R030 Elevated blood-pressure reading, without diagnosis of hypertension: Secondary | ICD-10-CM | POA: Diagnosis not present

## 2019-02-13 DIAGNOSIS — Z01818 Encounter for other preprocedural examination: Secondary | ICD-10-CM | POA: Diagnosis not present

## 2019-02-18 DIAGNOSIS — M5417 Radiculopathy, lumbosacral region: Secondary | ICD-10-CM | POA: Diagnosis not present

## 2019-02-18 DIAGNOSIS — M5416 Radiculopathy, lumbar region: Secondary | ICD-10-CM | POA: Diagnosis not present

## 2019-04-03 ENCOUNTER — Ambulatory Visit: Payer: Managed Care, Other (non HMO) | Attending: Internal Medicine

## 2019-04-03 DIAGNOSIS — Z23 Encounter for immunization: Secondary | ICD-10-CM

## 2019-04-03 NOTE — Progress Notes (Signed)
   Covid-19 Vaccination Clinic  Name:  Jeremy Hamilton    MRN: [id number] DOB: [birth year]  04/03/2019  [name] was observed post Covid-19 immunization for 15 minutes without incident. He was provided with Vaccine Information Sheet and instruction to access the V-Safe system.   [name] was instructed to call 911 with any severe reactions post vaccine: Marland Kitchen Difficulty breathing  . Swelling of face and throat  . A fast heartbeat  . A bad rash all over body  . Dizziness and weakness   Immunizations Administered    Name Date Dose VIS Date Route   Pfizer COVID-19 Vaccine 04/03/2019 12:56 PM 0.3 mL 12/13/2018 Intramuscular   Manufacturer: [employer], [employer]   Lot: [id number]   NDC: [address]-8

## 2019-04-10 ENCOUNTER — Ambulatory Visit (INDEPENDENT_AMBULATORY_CARE_PROVIDER_SITE_OTHER): Payer: Managed Care, Other (non HMO) | Admitting: Orthopedic Surgery

## 2019-04-10 ENCOUNTER — Other Ambulatory Visit: Payer: Self-pay

## 2019-04-10 ENCOUNTER — Encounter: Payer: Self-pay | Admitting: Orthopedic Surgery

## 2019-04-10 DIAGNOSIS — M6701 Short Achilles tendon (acquired), right ankle: Secondary | ICD-10-CM

## 2019-04-10 DIAGNOSIS — M722 Plantar fascial fibromatosis: Secondary | ICD-10-CM

## 2019-04-10 NOTE — Progress Notes (Signed)
Office Visit Note   Patient: Jeremy Hamilton           Date of Birth: [birth year]           MRN: [id number] Visit Date: 04/10/2019              Requested by: Jadene Pierini, MD [address] [place],  [place] [address] PCP: Patient, No Pcp Per  Chief Complaint  Patient presents with  . Leg Pain      HPI: Patient is a 34 year old gentleman who presents with plantar fascial pain on the right.  Patient states he has had an injection by podiatry without relief.  Patient has also had right-sided radicular symptoms he has undergone surgery in February for excision of a synovial cyst by neurosurgery.  His MRI scan showed pathology at L5-S1.  Patient states he still has some radicular pain from the buttocks to the right thigh which is worse with sitting and lying down.  He states the pain in the plantar fascia is worse in the morning with start up.  Patient states he also has some calf pain associated with the plantar fascia pain.  Assessment & Plan: Visit Diagnoses:  1. Achilles tendon contracture, right   2. Plantar fasciitis, right     Plan: Patient has no signs or symptoms of radicular pain down to the foot he has no signs or symptoms of a DVT.  He does have Achilles contracture which is most likely subacute secondary to his right-sided radicular symptoms.  Patient is given instructions for Achilles stretching he will do this 5 times a day a minute at a time.  Discussed that if he does not see any improvement in a month or 2 to follow-up for repeat evaluation.  Follow-Up Instructions: Return if symptoms worsen or fail to improve.   Ortho Exam  Patient is alert, oriented, no adenopathy, well-dressed, normal affect, normal respiratory effort. Examination patient has a good dorsalis pedis pulse he has an antalgic gait with Achilles contracture with his heel barely touching the ground.  He has tenderness to palpation the origin of the plantar fascia the Achilles tendon is nontender to  palpation.  The calf is soft without swelling he does have some tenderness to palpation the posterior aspect of the calf the right calf is smaller than the left calf with the right calf 36 cm in circumference left calf 37 cm in circumference.  With his knee extended patient lacks dorsiflexion of the ankle about 20 degrees short of neutral with significant Achilles contracture.  Lateral compression of the calcaneus is negative no evidence of tarsal tunnel syndrome or stress fracture.  Imaging: No results found. No images are attached to the encounter.  Labs: No results found for: HGBA1C, ESRSEDRATE, CRP, LABURIC, REPTSTATUS, GRAMSTAIN, CULT, LABORGA   No results found for: ALBUMIN, PREALBUMIN, LABURIC  No results found for: MG No results found for: VD25OH  No results found for: PREALBUMIN No flowsheet data found.   There is no height or weight on file to calculate BMI.  Orders:  No orders of the defined types were placed in this encounter.  No orders of the defined types were placed in this encounter.    Procedures: No procedures performed  Clinical Data: No additional findings.  ROS:  All other systems negative, except as noted in the HPI. Review of Systems  Objective: Vital Signs: There were no vitals taken for this visit.  Specialty Comments:  No specialty comments available.  PMFS History: Patient Active Problem List   Diagnosis Date Noted  . Osteitis pubis ([place]) 12/23/2014   History reviewed. No pertinent past medical history.  History reviewed. No pertinent family history.  History reviewed. No pertinent surgical history. Social History   Occupational History  . Not on file  Tobacco Use  . Smoking status: Never Smoker  Substance and Sexual Activity  . Alcohol use: No  . Drug use: No  . Sexual activity: Not on file

## 2019-04-28 ENCOUNTER — Ambulatory Visit: Payer: Managed Care, Other (non HMO) | Attending: Internal Medicine

## 2019-04-28 DIAGNOSIS — Z23 Encounter for immunization: Secondary | ICD-10-CM

## 2019-04-28 NOTE — Progress Notes (Signed)
   Covid-19 Vaccination Clinic  Name:  Jeremy Hamilton    MRN: [id number] DOB: 1985-12-11  04/28/2019  Jeremy Hamilton was observed post Covid-19 immunization for 15 minutes without incident. He was provided with Vaccine Information Sheet and instruction to access the V-Safe system.   Jeremy Hamilton was instructed to call 911 with any severe reactions post vaccine: Marland Kitchen Difficulty breathing  . Swelling of face and throat  . A fast heartbeat  . A bad rash all over body  . Dizziness and weakness   Immunizations Administered    Name Date Dose VIS Date Route   Pfizer COVID-19 Vaccine 04/28/2019 11:30 AM 0.3 mL 02/26/2018 Intramuscular   Manufacturer: [employer], [employer]   Lot: [id number]   NDC: [address]-0

## 2019-05-12 DIAGNOSIS — M545 Low back pain: Secondary | ICD-10-CM | POA: Diagnosis not present

## 2019-05-12 DIAGNOSIS — M5416 Radiculopathy, lumbar region: Secondary | ICD-10-CM | POA: Diagnosis not present

## 2019-05-12 DIAGNOSIS — M6281 Muscle weakness (generalized): Secondary | ICD-10-CM | POA: Diagnosis not present

## 2019-05-14 DIAGNOSIS — M6281 Muscle weakness (generalized): Secondary | ICD-10-CM | POA: Diagnosis not present

## 2019-05-14 DIAGNOSIS — M5416 Radiculopathy, lumbar region: Secondary | ICD-10-CM | POA: Diagnosis not present

## 2019-05-14 DIAGNOSIS — M545 Low back pain: Secondary | ICD-10-CM | POA: Diagnosis not present

## 2019-05-19 DIAGNOSIS — M5416 Radiculopathy, lumbar region: Secondary | ICD-10-CM | POA: Diagnosis not present

## 2019-05-19 DIAGNOSIS — M6281 Muscle weakness (generalized): Secondary | ICD-10-CM | POA: Diagnosis not present

## 2019-05-19 DIAGNOSIS — M545 Low back pain: Secondary | ICD-10-CM | POA: Diagnosis not present

## 2019-05-21 DIAGNOSIS — M6281 Muscle weakness (generalized): Secondary | ICD-10-CM | POA: Diagnosis not present

## 2019-05-21 DIAGNOSIS — M5416 Radiculopathy, lumbar region: Secondary | ICD-10-CM | POA: Diagnosis not present

## 2019-05-21 DIAGNOSIS — M545 Low back pain: Secondary | ICD-10-CM | POA: Diagnosis not present

## 2019-05-26 DIAGNOSIS — M545 Low back pain: Secondary | ICD-10-CM | POA: Diagnosis not present

## 2019-05-26 DIAGNOSIS — M5416 Radiculopathy, lumbar region: Secondary | ICD-10-CM | POA: Diagnosis not present

## 2019-05-26 DIAGNOSIS — M6281 Muscle weakness (generalized): Secondary | ICD-10-CM | POA: Diagnosis not present

## 2019-05-28 DIAGNOSIS — M5416 Radiculopathy, lumbar region: Secondary | ICD-10-CM | POA: Diagnosis not present

## 2019-05-28 DIAGNOSIS — M6281 Muscle weakness (generalized): Secondary | ICD-10-CM | POA: Diagnosis not present

## 2019-05-28 DIAGNOSIS — M545 Low back pain: Secondary | ICD-10-CM | POA: Diagnosis not present

## 2019-06-04 DIAGNOSIS — M6281 Muscle weakness (generalized): Secondary | ICD-10-CM | POA: Diagnosis not present

## 2019-06-04 DIAGNOSIS — M5416 Radiculopathy, lumbar region: Secondary | ICD-10-CM | POA: Diagnosis not present

## 2019-06-04 DIAGNOSIS — M545 Low back pain: Secondary | ICD-10-CM | POA: Diagnosis not present

## 2019-06-06 DIAGNOSIS — M545 Low back pain: Secondary | ICD-10-CM | POA: Diagnosis not present

## 2019-06-06 DIAGNOSIS — M5416 Radiculopathy, lumbar region: Secondary | ICD-10-CM | POA: Diagnosis not present

## 2019-06-06 DIAGNOSIS — M6281 Muscle weakness (generalized): Secondary | ICD-10-CM | POA: Diagnosis not present

## 2019-06-09 DIAGNOSIS — M545 Low back pain: Secondary | ICD-10-CM | POA: Diagnosis not present

## 2019-06-09 DIAGNOSIS — M5416 Radiculopathy, lumbar region: Secondary | ICD-10-CM | POA: Diagnosis not present

## 2019-06-09 DIAGNOSIS — M6281 Muscle weakness (generalized): Secondary | ICD-10-CM | POA: Diagnosis not present

## 2019-06-13 DIAGNOSIS — M545 Low back pain: Secondary | ICD-10-CM | POA: Diagnosis not present

## 2019-06-13 DIAGNOSIS — M5416 Radiculopathy, lumbar region: Secondary | ICD-10-CM | POA: Diagnosis not present

## 2019-06-13 DIAGNOSIS — M6281 Muscle weakness (generalized): Secondary | ICD-10-CM | POA: Diagnosis not present

## 2019-06-18 DIAGNOSIS — M545 Low back pain: Secondary | ICD-10-CM | POA: Diagnosis not present

## 2019-06-18 DIAGNOSIS — M5416 Radiculopathy, lumbar region: Secondary | ICD-10-CM | POA: Diagnosis not present

## 2019-06-18 DIAGNOSIS — M6281 Muscle weakness (generalized): Secondary | ICD-10-CM | POA: Diagnosis not present

## 2019-06-20 DIAGNOSIS — M5416 Radiculopathy, lumbar region: Secondary | ICD-10-CM | POA: Diagnosis not present

## 2019-06-20 DIAGNOSIS — M545 Low back pain: Secondary | ICD-10-CM | POA: Diagnosis not present

## 2019-06-20 DIAGNOSIS — M6281 Muscle weakness (generalized): Secondary | ICD-10-CM | POA: Diagnosis not present

## 2019-06-25 DIAGNOSIS — M545 Low back pain: Secondary | ICD-10-CM | POA: Diagnosis not present

## 2019-06-25 DIAGNOSIS — M6281 Muscle weakness (generalized): Secondary | ICD-10-CM | POA: Diagnosis not present

## 2019-06-25 DIAGNOSIS — M5416 Radiculopathy, lumbar region: Secondary | ICD-10-CM | POA: Diagnosis not present

## 2019-06-27 DIAGNOSIS — M6281 Muscle weakness (generalized): Secondary | ICD-10-CM | POA: Diagnosis not present

## 2019-06-27 DIAGNOSIS — M5416 Radiculopathy, lumbar region: Secondary | ICD-10-CM | POA: Diagnosis not present

## 2019-06-27 DIAGNOSIS — M545 Low back pain: Secondary | ICD-10-CM | POA: Diagnosis not present

## 2019-07-03 DIAGNOSIS — M545 Low back pain: Secondary | ICD-10-CM | POA: Diagnosis not present

## 2019-07-03 DIAGNOSIS — M6281 Muscle weakness (generalized): Secondary | ICD-10-CM | POA: Diagnosis not present

## 2019-07-03 DIAGNOSIS — M5416 Radiculopathy, lumbar region: Secondary | ICD-10-CM | POA: Diagnosis not present

## 2019-07-04 DIAGNOSIS — M545 Low back pain: Secondary | ICD-10-CM | POA: Diagnosis not present

## 2019-07-04 DIAGNOSIS — M5416 Radiculopathy, lumbar region: Secondary | ICD-10-CM | POA: Diagnosis not present

## 2019-07-04 DIAGNOSIS — M6281 Muscle weakness (generalized): Secondary | ICD-10-CM | POA: Diagnosis not present

## 2019-07-09 DIAGNOSIS — M5416 Radiculopathy, lumbar region: Secondary | ICD-10-CM | POA: Diagnosis not present

## 2019-07-09 DIAGNOSIS — M545 Low back pain: Secondary | ICD-10-CM | POA: Diagnosis not present

## 2019-07-09 DIAGNOSIS — M6281 Muscle weakness (generalized): Secondary | ICD-10-CM | POA: Diagnosis not present

## 2019-07-11 DIAGNOSIS — M545 Low back pain: Secondary | ICD-10-CM | POA: Diagnosis not present

## 2019-07-11 DIAGNOSIS — M6281 Muscle weakness (generalized): Secondary | ICD-10-CM | POA: Diagnosis not present

## 2019-07-11 DIAGNOSIS — M5416 Radiculopathy, lumbar region: Secondary | ICD-10-CM | POA: Diagnosis not present

## 2019-07-21 DIAGNOSIS — M6281 Muscle weakness (generalized): Secondary | ICD-10-CM | POA: Diagnosis not present

## 2019-07-21 DIAGNOSIS — M5416 Radiculopathy, lumbar region: Secondary | ICD-10-CM | POA: Diagnosis not present

## 2019-07-21 DIAGNOSIS — M545 Low back pain: Secondary | ICD-10-CM | POA: Diagnosis not present

## 2019-07-23 DIAGNOSIS — M545 Low back pain: Secondary | ICD-10-CM | POA: Diagnosis not present

## 2019-07-23 DIAGNOSIS — M6281 Muscle weakness (generalized): Secondary | ICD-10-CM | POA: Diagnosis not present

## 2019-07-23 DIAGNOSIS — M5416 Radiculopathy, lumbar region: Secondary | ICD-10-CM | POA: Diagnosis not present

## 2019-07-30 DIAGNOSIS — M5416 Radiculopathy, lumbar region: Secondary | ICD-10-CM | POA: Diagnosis not present

## 2019-07-30 DIAGNOSIS — M545 Low back pain: Secondary | ICD-10-CM | POA: Diagnosis not present

## 2019-07-30 DIAGNOSIS — M6281 Muscle weakness (generalized): Secondary | ICD-10-CM | POA: Diagnosis not present

## 2019-08-01 DIAGNOSIS — M6281 Muscle weakness (generalized): Secondary | ICD-10-CM | POA: Diagnosis not present

## 2019-08-01 DIAGNOSIS — M5416 Radiculopathy, lumbar region: Secondary | ICD-10-CM | POA: Diagnosis not present

## 2019-08-01 DIAGNOSIS — M545 Low back pain: Secondary | ICD-10-CM | POA: Diagnosis not present

## 2019-08-06 DIAGNOSIS — M5416 Radiculopathy, lumbar region: Secondary | ICD-10-CM | POA: Diagnosis not present

## 2019-08-06 DIAGNOSIS — M6281 Muscle weakness (generalized): Secondary | ICD-10-CM | POA: Diagnosis not present

## 2019-08-06 DIAGNOSIS — M545 Low back pain: Secondary | ICD-10-CM | POA: Diagnosis not present

## 2019-08-08 DIAGNOSIS — M5416 Radiculopathy, lumbar region: Secondary | ICD-10-CM | POA: Diagnosis not present

## 2019-08-08 DIAGNOSIS — M6281 Muscle weakness (generalized): Secondary | ICD-10-CM | POA: Diagnosis not present

## 2019-08-08 DIAGNOSIS — M545 Low back pain: Secondary | ICD-10-CM | POA: Diagnosis not present

## 2019-08-18 DIAGNOSIS — Z0000 Encounter for general adult medical examination without abnormal findings: Secondary | ICD-10-CM | POA: Diagnosis not present

## 2019-08-18 DIAGNOSIS — Z13220 Encounter for screening for lipoid disorders: Secondary | ICD-10-CM | POA: Diagnosis not present

## 2019-08-18 DIAGNOSIS — Z23 Encounter for immunization: Secondary | ICD-10-CM | POA: Diagnosis not present

## 2019-10-03 ENCOUNTER — Ambulatory Visit: Payer: BC Managed Care – PPO | Attending: Family Medicine | Admitting: Physical Therapy

## 2019-10-03 ENCOUNTER — Encounter: Payer: Self-pay | Admitting: Physical Therapy

## 2019-10-03 ENCOUNTER — Other Ambulatory Visit: Payer: Self-pay

## 2019-10-03 DIAGNOSIS — M6281 Muscle weakness (generalized): Secondary | ICD-10-CM | POA: Diagnosis not present

## 2019-10-03 DIAGNOSIS — M25561 Pain in right knee: Secondary | ICD-10-CM

## 2019-10-03 DIAGNOSIS — M25562 Pain in left knee: Secondary | ICD-10-CM | POA: Insufficient documentation

## 2019-10-03 NOTE — Therapy (Signed)
[facility] [address] [place], [place], [address] Phone: [phone]   Fax:  [phone]  Physical Therapy Evaluation  Patient Details  Name: Jeremy Hamilton MRN: [id number] Date of Birth: 10/16/1985 Referring Provider (PT): Dr. Darrow Bussing   Encounter Date: 10/03/2019   PT End of Session - 10/03/19 0914    Visit Number 1    Date for PT Re-Evaluation 11/28/19    PT Start Time 0809    PT Stop Time 0850    PT Time Calculation (min) 41 min    Activity Tolerance Patient tolerated treatment well           History reviewed. No pertinent past medical history.  History reviewed. No pertinent surgical history.  There were no vitals filed for this visit.    Subjective Assessment - 10/03/19 0811    Subjective Bilateral knee pain patellar tendon began with return to soccer over the  summer.  Takes about 1 Advil a day.    Pertinent History Back surgery in Feb for synovial cyst had bil foot pain (arch and calf);  had Covid in Nov    Limitations Other (comment)    How long can you sit comfortably? sits all day for work    How long can you walk comfortably? > 1 mile no problem    Diagnostic tests none    Patient Stated Goals feel better, stop hurting    Currently in Pain? No/denies    Pain Score 0-No pain    Pain Location Knee    Pain Orientation Left;Right    Pain Type Acute pain    Pain Radiating Towards patellar tendon region    Pain Onset More than a month ago    Pain Frequency Intermittent    Aggravating Factors  down stairs. first thing in morning; running for soccer stopping and turning    Pain Relieving Factors better as the day goes on              [facility] PT Assessment - 10/03/19 0001      Assessment   Medical Diagnosis bil patellar tendonitis/bursitis     Referring Provider (PT) Dr. Carilyn Goodpasture Koirala    Onset Date/Surgical Date --   Summertime   Next MD Visit December    Prior Therapy PT before  and after back surgery  In Feb     Precautions   Precautions None      Restrictions   Weight Bearing Restrictions No      Balance Screen   Has the patient fallen in the past 6 months No    Has the patient had a decrease in activity level because of a fear of falling?  No    Is the patient reluctant to leave their home because of a fear of falling?  No      Home [employer]ist information centre manager residence      Prior Function   Level of Independence Independent    Vocation Full time employment    Vocation Requirements sitting     Leisure soccer; jump on trampoline with his boys      Observation/Other Assessments   Focus on Therapeutic Outcomes (FOTO)  23% limitation       Squat   Comments knees significantly forward (well past toes); heels come off floor for 1/2 squat; with heels down only 1/4 squat achieved       Single Leg Stance   Comments 5 sec bil  Posture/Postural Control   Posture Comments right ankle edema       AROM   Right Knee Extension 0    Right Knee Flexion 140    Left Knee Extension 0    Left Knee Flexion 140      Strength   Overall Strength Comments abdominals 4/5;  plank 10 sec very difficult     Right Hip ABduction 4/5    Left Hip ABduction 4/5    Right Knee Flexion 4+/5    Right Knee Extension 4+/5    Left Knee Flexion 4+/5    Left Knee Extension 4+/5      Flexibility   Soft Tissue Assessment /Muscle Length --   shortened gastroc bil    Hamstrings 70 degrees    Quadriceps limited to 5 degrees bil       Palpation   Patella mobility mild discomfort with quad set     Palpation comment mild tenderness over bil patellar fat pads       Step-up/Step Down   Comments poor trunk control with 6 inch step down test (lateral lean and rotation)                       Objective measurements completed on examination: See above findings.               PT Education - 10/03/19 0913    Education Details runner stretch;  supine hip flexor stretch; planks, supine abdominals, wall hip abduction isometric;  hip hinge over chair;  discussed patellar tendon strap for soccer    Person(s) Educated Patient    Methods Explanation;Demonstration;Handout    Comprehension Returned demonstration;Verbalized understanding            PT Short Term Goals - 10/03/19 0927      PT SHORT TERM GOAL #1   Title The patient will demonstrate knowledge of basic HEP and concepts to promote healing    Time 4    Period Weeks    Status New    Target Date 10/31/19      PT SHORT TERM GOAL #2   Title The patient will report a 40% improvement in bil anterior knee pain with playing soccer, descending stairs and first thing in the morning    Time 4    Period Weeks    Status New      PT SHORT TERM GOAL #3   Title Improved gastroc muscle length (10 degrees DF) and hip flexor lengths (at least 15 degrees hip extension) needed for running and squatting    Time 4    Period Weeks    Status New             PT Long Term Goals - 10/03/19 4010      PT LONG TERM GOAL #1   Title The patient will be independent in safe self progression of HEP for further strengthening and best technique for squatting    Time 8    Period Weeks    Status New    Target Date 11/28/19      PT LONG TERM GOAL #2   Title The patient will report a 60% improvement in bil anterior knee pain with playing soccer (stopping,turning), descending stairs and in the early mornings    Time 8    Period Weeks    Status New      PT LONG TERM GOAL #3   Title Trunk, hip abductor and quad strength grossly  4+/5 to 5-/5 needed for soccer (running, stopping, turning)    Time 8    Period Weeks    Status New      PT LONG TERM GOAL #4   Title FOTO functional outcome score improved from 23% limitation to 14%    Time 8    Period Weeks    Status New                  Plan - 10/03/19 0901    Clinical Impression Statement The patient had  a lumbar synovial cyst  which caused bil foot pain in addition to back pain.  He underwent surgery in February and had follow up PT.  He continues to do stretches for his back.  This past summer he returned to playing some soccer and as a result began to have bil knee pain in patellar tendon region.  He reports the pain occurs when stopping from running, changing direction, descending stairs and in the mornings.  He is not painful with walking and usually not sitting.  He has full knee ROM bilaterally but shortened gastroc, HS and hip flexor muscle lengths.  When squatting his knees are significantly forward well past his toes and his heels come off the floor with a 1/2 squat.  Decreased trunk strength especially abdominals.  Bil hip abduction strength 4/5.  Knee strength grossly 4+/5.  Poor trunk control with step down test.  Tenderness over bil patellar fat pads and present with quad set.  He would benefit from PT to address these deficits.    Personal Factors and Comorbidities Comorbidity 1    Comorbidities < 1 year post op lumbar surgery    Examination-Activity Limitations Other;Stairs;Squat    Examination-Participation Restrictions Other    Stability/Clinical Decision Making Stable/Uncomplicated    Clinical Decision Making Low    Rehab Potential Good    PT Frequency 1x / week    PT Duration 8 weeks    PT Treatment/Interventions ADLs/Self Care Home Management;Cryotherapy;Electrical Stimulation;Ultrasound;Moist Heat;Iontophoresis 4mg /ml Dexamethasone;Neuromuscular re-education;Therapeutic exercise;Therapeutic activities;Patient/family education;Manual techniques;Dry needling;Taping    PT Next Visit Plan review intial HEP;  recheck for better hip hinge/knee alignment with squatting (over chair);  possible McConnell patellar tendon taping;  discuss appropriate gym equipment; core and glute med strengthening    PT Home Exercise Plan [email or web address]    Consulted and Agree with Plan of Care Patient           Patient will benefit  from skilled therapeutic intervention in order to improve the following deficits and impairments:  Pain, Decreased activity tolerance, Decreased strength  Visit Diagnosis: Acute pain of right knee - Plan: PT plan of care cert/re-cert  Acute pain of left knee - Plan: PT plan of care cert/re-cert  Muscle weakness (generalized) - Plan: PT plan of care cert/re-cert     Problem List Patient Active Problem List   Diagnosis Date Noted  . Osteitis pubis (HCC) 12/23/2014   12/25/2014, PT 10/03/19 9:52 AM Phone: [phone] Fax: [phone] 10/03/2019, 9:51 AM  [facility] [address] [place], [place], [place] Phone: [phone]   Fax:  [phone]  Name: Jeremy Hamilton MRN: [name] Date of Birth: [birth year]

## 2019-10-03 NOTE — Patient Instructions (Signed)
Access Code: [email or web address] URL: [email or web address] Date: 10/03/2019 Prepared by: Lavinia Sharps  Exercises Gastroc Stretch on Wall - 1 x daily - 7 x weekly - 1 sets - 3 reps - 20 hold Hip Flexor Stretch at Edge of Bed - 1 x daily - 7 x weekly - 1 sets - 3 reps - 20 hold Standard Plank - 1 x daily - 7 x weekly - 1 sets - 5 reps - 5 hold Supine 90/90 Shoulder Flexion with Abdominal Bracing - 1 x daily - 7 x weekly - 3 sets - 10 reps Sit to Stand without Arm Support - 3 x daily - 7 x weekly - 1 sets - 5 reps Standing Isometric Hip Abduction with Ball on Wall - 1 x daily - 7 x weekly - 1 sets - 10 reps

## 2019-10-10 ENCOUNTER — Ambulatory Visit: Payer: BC Managed Care – PPO | Admitting: Physical Therapy

## 2019-10-10 ENCOUNTER — Other Ambulatory Visit: Payer: Self-pay

## 2019-10-10 DIAGNOSIS — M6281 Muscle weakness (generalized): Secondary | ICD-10-CM

## 2019-10-10 DIAGNOSIS — M25562 Pain in left knee: Secondary | ICD-10-CM | POA: Diagnosis not present

## 2019-10-10 DIAGNOSIS — M25561 Pain in right knee: Secondary | ICD-10-CM

## 2019-10-10 NOTE — Patient Instructions (Signed)
Access Code: [email or web address] URL: [email or web address] Date: 10/10/2019 Prepared by: Lavinia Sharps  Exercises Gastroc Stretch on Wall - 1 x daily - 7 x weekly - 1 sets - 3 reps - 20 hold Hip Flexor Stretch at Edge of Bed - 1 x daily - 7 x weekly - 1 sets - 3 reps - 20 hold Standard Plank - 1 x daily - 7 x weekly - 1 sets - 5 reps - 5 hold Supine 90/90 Shoulder Flexion with Abdominal Bracing - 1 x daily - 7 x weekly - 3 sets - 10 reps Sit to Stand without Arm Support - 3 x daily - 7 x weekly - 1 sets - 5 reps Standing Isometric Hip Abduction with Ball on Wall - 1 x daily - 7 x weekly - 1 sets - 10 reps Squat with Chair Touch and Resistance Loop - 1 x daily - 7 x weekly - 3-5 sets - 1 reps - 45 hold Side Stepping with Resistance at Thighs - 1 x daily - 7 x weekly - 1 sets - 10 reps

## 2019-10-10 NOTE — Therapy (Signed)
[facility] [address], [address] [place], [place], [address] Phone: [phone]   Fax:  [phone]  Physical Therapy Treatment  Patient Details  Name: Jeremy [facility] MRN: [id number] Date of Birth: 1985-10-23 Referring Provider (PT): Dr. Darrow Bussing   Encounter Date: 10/10/2019   PT End of Session - 10/10/19 1214    Visit Number 2    Date for PT Re-Evaluation 11/28/19    Authorization Type BCBS    PT Start Time 1015    PT Stop Time 1100    PT Time Calculation (min) 45 min    Activity Tolerance Patient tolerated treatment well           No past medical history on file.  No past surgical history on file.  There were no vitals filed for this visit.   Subjective Assessment - 10/10/19 1019    Subjective I did too much too soon.  I played in 2 soccer matches Sunday and I pulled my calf.  Severe limp.  Patient states his calf has improved but still swollen.    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 Yes    Pain Score 6     Pain Location Calf    Pain Orientation Right;Left                             [facility] Adult PT Treatment/Exercise - 10/10/19 0001      Lumbar Exercises: Supine   Other Supine Lumbar Exercises  modified plank 30 sec        Lumbar Exercises: Sidelying   Other Sidelying Lumbar Exercises side plank 30 sec       Knee/Hip Exercises: Machines for Strengthening   Cybex Knee Extension isometrically at 60 degrees 45 sec hold 1x       Knee/Hip Exercises: Standing   Hip ADduction Limitations ball on wall isometrics 5 sec hold with added mini squats 2x10 right/left     Extension Limitations floor sliders hip extension and abduction 10x right/left     SLS with Vectors black loop above knees lateral and diagonal taps 10x right/left     Other Standing Knee Exercises Spanish squats with black band  around knees 3x 45 sec isometric holds                   PT Education - 10/10/19 1214    Education Details Spanish squats isometric holds; band hip taps    Person(s) Educated Patient    Methods Explanation;Demonstration;Handout    Comprehension Returned demonstration;Verbalized understanding            PT Short Term Goals - 10/03/19 0927      PT SHORT TERM GOAL #1   Title The patient will demonstrate knowledge of basic HEP and concepts to promote healing    Time 4    Period Weeks    Status New    Target Date 10/31/19      PT SHORT TERM GOAL #2   Title The patient will report a 40% improvement in bil anterior knee pain with playing soccer, descending stairs and first thing in the morning    Time 4    Period Weeks    Status New      PT SHORT TERM GOAL #3   Title Improved gastroc muscle length (  10 degrees DF) and hip flexor lengths (at least 15 degrees hip extension) needed for running and squatting    Time 4    Period Weeks    Status New             PT Long Term Goals - 10/03/19 0109      PT LONG TERM GOAL #1   Title The patient will be independent in safe self progression of HEP for further strengthening and best technique for squatting    Time 8    Period Weeks    Status New    Target Date 11/28/19      PT LONG TERM GOAL #2   Title The patient will report a 60% improvement in bil anterior knee pain with playing soccer (stopping,turning), descending stairs and in the early mornings    Time 8    Period Weeks    Status New      PT LONG TERM GOAL #3   Title Trunk, hip abductor and quad strength grossly  4+/5 to 5-/5 needed for soccer (running, stopping, turning)    Time 8    Period Weeks    Status New      PT LONG TERM GOAL #4   Title FOTO functional outcome score improved from 23% limitation to 14%    Time 8    Period Weeks    Status New                 Plan - 10/10/19 1057    Clinical Impression Statement The patient returns after  initial evaluation for bil patellar tendon pain.  He has a new complication of a calf strain after he played in 2 soccer matches on Sunday and is ambulating with a significant limp.   He is able to initiate strengthening with isometrics with Spanish squats and on knee extension machine.  Hip and core strengthening performed although modified secondary to calf strain.  Core weakness evident with compensatory trunk leans and rotations with fatigue (back surgery < 1 year ago).  Recommended hold on wall gastroc stretch this week due to calf strain.    Comorbidities < 1 year post op lumbar surgery    Examination-Activity Limitations Other;Stairs;Squat    Rehab Potential Good    PT Frequency 1x / week    PT Duration 8 weeks    PT Treatment/Interventions ADLs/Self Care Home Management;Cryotherapy;Electrical Stimulation;Ultrasound;Moist Heat;Iontophoresis 4mg /ml Dexamethasone;Neuromuscular re-education;Therapeutic exercise;Therapeutic activities;Patient/family education;Manual techniques;Dry needling;Taping    PT Next Visit Plan patellar tendon isometrics progression to eccentrics;  glute medius and core strengthening;  may need ex modifications due to new calf strain    PT Home Exercise Plan [id number]           Patient will benefit from skilled therapeutic intervention in order to improve the following deficits and impairments:  Pain, Decreased activity tolerance, Decreased strength  Visit Diagnosis: Acute pain of right knee  Acute pain of left knee  Muscle weakness (generalized)     Problem List Patient Active Problem List   Diagnosis Date Noted  . 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 [place], [place], [place] Phone: [phone]   Fax:  [phone]  Name: Jeremy [facility] MRN:  [name] Date of Birth: [birth year]

## 2019-10-17 ENCOUNTER — Encounter: Payer: Self-pay | Admitting: Physical Therapy

## 2019-10-17 ENCOUNTER — Other Ambulatory Visit: Payer: Self-pay

## 2019-10-17 ENCOUNTER — Ambulatory Visit: Payer: BC Managed Care – PPO | Admitting: Physical Therapy

## 2019-10-17 DIAGNOSIS — M6281 Muscle weakness (generalized): Secondary | ICD-10-CM

## 2019-10-17 DIAGNOSIS — M25561 Pain in right knee: Secondary | ICD-10-CM

## 2019-10-17 DIAGNOSIS — M25562 Pain in left knee: Secondary | ICD-10-CM | POA: Diagnosis not present

## 2019-10-17 NOTE — Therapy (Signed)
[facility] [address] [place], [place], [address] Phone: [phone]   Fax:  [phone]  Physical Therapy Treatment  Patient Details  Name: BRAIDAN RICCIARDI MRN: [id number] Date of Birth: [birth year] Referring Provider (PT): Dr. Darrow Bussing   Encounter Date: 10/17/2019   PT End of Session - 10/17/19 1009    Visit Number 3    Date for PT Re-Evaluation 11/28/19    Authorization Type BCBS    PT Start Time 0920    PT Stop Time 1010    PT Time Calculation (min) 50 min    Activity Tolerance Patient tolerated treatment well    Behavior During Therapy [facility] for tasks assessed/performed           History reviewed. No pertinent past medical history.  History reviewed. No pertinent surgical history.  There were no vitals filed for this visit.   Subjective Assessment - 10/17/19 0931    Subjective I am much better today, no current pain.    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 No/denies                             [facility] Adult PT Treatment/Exercise - 10/17/19 0001      Knee/Hip Exercises: Stretches   Active Hamstring Stretch Both;2 reps;30 seconds      Knee/Hip Exercises: Aerobic   Recumbent Bike L2 x 10 min witrh concurrent discussion on status      Knee/Hip Exercises: Machines for Strengthening   Total Gym Leg Press Seat 7: 50# eccentrics 2x10      Knee/Hip Exercises: Standing   SLS with Vectors black loop above knees lateral and diagonal taps 10x right/left     Other Standing Knee Exercises Upside down BOSU static squats 3x 20 sec     Other Standing Knee Exercises 10# kettel bell deadlifts with micro knee bend 2x10: VC for technique                    PT Short Term Goals - 10/17/19 1001      PT SHORT TERM GOAL #1   Title The patient will demonstrate knowledge of basic HEP and  concepts to promote healing    Time 4    Period Weeks    Status Achieved    Target Date 10/31/19      PT SHORT TERM GOAL #2   Title The patient will report a 40% improvement in bil anterior knee pain with playing soccer, descending stairs and first thing in the morning    Time 4    Period Weeks    Status --   25%            PT Long Term Goals - 10/03/19 3716      PT LONG TERM GOAL #1   Title The patient will be independent in safe self progression of HEP for further strengthening and best technique for squatting    Time 8    Period Weeks    Status New    Target Date 11/28/19      PT LONG TERM GOAL #2   Title The patient will report a 60% improvement in bil anterior knee pain with playing soccer (stopping,turning), descending stairs and in the early mornings    Time 8  Period Weeks    Status New      PT LONG TERM GOAL #3   Title Trunk, hip abductor and quad strength grossly  4+/5 to 5-/5 needed for soccer (running, stopping, turning)    Time 8    Period Weeks    Status New      PT LONG TERM GOAL #4   Title FOTO functional outcome score improved from 23% limitation to 14%    Time 8    Period Weeks    Status New                 Plan - 10/17/19 0943    Clinical Impression Statement Pt presents today with resolved calf pain and no knee pain. Pt had soccer practice yesterday and was not limited by any knee pain. Pt was successful with eccentric quad exercises today, no pan. Pt reports an overall improvement/reduction in knee pain by 25%.    Personal Factors and Comorbidities Comorbidity 1    Comorbidities < 1 year post op lumbar surgery    Examination-Activity Limitations Other;Stairs;Squat    Examination-Participation Restrictions Other    Stability/Clinical Decision Making Stable/Uncomplicated    Rehab Potential Good    PT Frequency 1x / week    PT Duration 8 weeks    PT Treatment/Interventions ADLs/Self Care Home Management;Cryotherapy;Electrical  Stimulation;Ultrasound;Moist Heat;Iontophoresis 4mg /ml Dexamethasone;Neuromuscular re-education;Therapeutic exercise;Therapeutic activities;Patient/family education;Manual techniques;Dry needling;Taping    PT Next Visit Plan Hip strength/glute medius, quad strength/core strength    PT Home Exercise Plan [id number]    Consulted and Agree with Plan of Care Patient           Patient will benefit from skilled therapeutic intervention in order to improve the following deficits and impairments:  Pain, Decreased activity tolerance, Decreased strength  Visit Diagnosis: Acute pain of right knee  Acute pain of left knee  Muscle weakness (generalized)     Problem List Patient Active Problem List   Diagnosis Date Noted  . 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 [place], [place], [place] Phone: [phone]   Fax:  [phone]  Name: JENSON BEEDLE MRN: [name] Date of Birth: [birth year]

## 2019-10-24 ENCOUNTER — Other Ambulatory Visit: Payer: Self-pay

## 2019-10-24 ENCOUNTER — Encounter: Payer: Self-pay | Admitting: Physical Therapy

## 2019-10-24 ENCOUNTER — Ambulatory Visit: Payer: BC Managed Care – PPO | Admitting: Physical Therapy

## 2019-10-24 DIAGNOSIS — M6281 Muscle weakness (generalized): Secondary | ICD-10-CM

## 2019-10-24 DIAGNOSIS — M25562 Pain in left knee: Secondary | ICD-10-CM | POA: Diagnosis not present

## 2019-10-24 DIAGNOSIS — M25561 Pain in right knee: Secondary | ICD-10-CM

## 2019-10-24 NOTE — Therapy (Signed)
[facility] [address] [place], [place], [address] Phone: [phone]   Fax:  [phone]  Physical Therapy Treatment  Patient Details  Name: Jeremy [facility] MRN: [id number] Date of Birth: 05/26/1985 Referring Provider (PT): Dr. Darrow Bussing   Encounter Date: 10/24/2019   PT End of Session - 10/24/19 0935    Visit Number 4    Date for PT Re-Evaluation 11/28/19    Authorization Type BCBS    PT Start Time 0932    PT Stop Time 1015    PT Time Calculation (min) 43 min    Activity Tolerance Patient tolerated treatment well    Behavior During Therapy [facility] for tasks assessed/performed           History reviewed. No pertinent past medical history.  History reviewed. No pertinent surgical history.  There were no vitals filed for this visit.   Subjective Assessment - 10/24/19 0936    Subjective No pain, played yesterday: stretched the hamstrings and did better after stretching.    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 No/denies    Multiple Pain Sites No                             [facility] Adult PT Treatment/Exercise - 10/24/19 0001      Knee/Hip Exercises: Stretches   Gastroc Stretch --   Bil on slant board 3x 30 sec   Gastroc Stretch Limitations TC to get pelvis more anterior      Knee/Hip Exercises: Aerobic   Recumbent Bike L4 x 6 min with PT Apresent to discuss progress.      Knee/Hip Exercises: Machines for Strengthening   Total Gym Leg Press Seat 7: eccentric Bil 60# 2x10       Knee/Hip Exercises: Standing   Lateral Step Up --   Lateral steppign yelow loop at ankle 4x    Other Standing Knee Exercises Single leg hip hinges 10x Bil    OCCASIONAL UE support on chair for balance   Other Standing Knee Exercises 15# kettlebell deadlifts 2x15       Knee/Hip Exercises: Supine   Other Supine  Knee/Hip Exercises Lt quad foam roll release in prone      Manual Therapy   Manual therapy comments Manual stretching of Bil quads Lt > RT, hip flexors stretching but less                    PT Short Term Goals - 10/17/19 1001      PT SHORT TERM GOAL #1   Title The patient will demonstrate knowledge of basic HEP and concepts to promote healing    Time 4    Period Weeks    Status Achieved    Target Date 10/31/19      PT SHORT TERM GOAL #2   Title The patient will report a 40% improvement in bil anterior knee pain with playing soccer, descending stairs and first thing in the morning    Time 4    Period Weeks    Status --   25%            PT Long Term Goals - 10/03/19 0017      PT LONG TERM GOAL #1   Title The patient will be independent in safe self progression  of HEP for further strengthening and best technique for squatting    Time 8    Period Weeks    Status New    Target Date 11/28/19      PT LONG TERM GOAL #2   Title The patient will report a 60% improvement in bil anterior knee pain with playing soccer (stopping,turning), descending stairs and in the early mornings    Time 8    Period Weeks    Status New      PT LONG TERM GOAL #3   Title Trunk, hip abductor and quad strength grossly  4+/5 to 5-/5 needed for soccer (running, stopping, turning)    Time 8    Period Weeks    Status New      PT LONG TERM GOAL #4   Title FOTO functional outcome score improved from 23% limitation to 14%    Time 8    Period Weeks    Status New                 Plan - 10/24/19 0935    Clinical Impression Statement Pt reports 30% improvement in pain reduction since evaluation. Pt was able to play soccer yesterday without pain and could "do the moves I wanted to do." Pt's Lt quad super tight vs the RT. PTA suggested pt increase his quad stretching frequency to 3x day on the LT. Pt verbally agreed.    Personal Factors and Comorbidities Comorbidity 1    Comorbidities  < 1 year post op lumbar surgery    Examination-Activity Limitations Other;Stairs;Squat    Examination-Participation Restrictions Other    Stability/Clinical Decision Making Stable/Uncomplicated    Rehab Potential Good    PT Frequency 1x / week    PT Duration 8 weeks    PT Treatment/Interventions ADLs/Self Care Home Management;Cryotherapy;Electrical Stimulation;Ultrasound;Moist Heat;Iontophoresis 4mg /ml Dexamethasone;Neuromuscular re-education;Therapeutic exercise;Therapeutic activities;Patient/family education;Manual techniques;Dry needling;Taping    PT Next Visit Plan Hip strength/glute medius, quad strength/core strength: stretch Lt quad    PT Home Exercise Plan [id number]    Consulted and Agree with Plan of Care Patient           Patient will benefit from skilled therapeutic intervention in order to improve the following deficits and impairments:  Pain, Decreased activity tolerance, Decreased strength  Visit Diagnosis: Acute pain of right knee  Acute pain of left knee  Muscle weakness (generalized)     Problem List Patient Active Problem List   Diagnosis Date Noted  . 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, [address] [place], [place], [place] Phone: [phone]   Fax:  [phone]  Name: [facility] MRN: [name] Date of Birth: 09-18-1985

## 2019-11-14 ENCOUNTER — Other Ambulatory Visit: Payer: Self-pay

## 2019-11-14 ENCOUNTER — Encounter: Payer: Self-pay | Admitting: Physical Therapy

## 2019-11-14 ENCOUNTER — Ambulatory Visit: Payer: BC Managed Care – PPO | Attending: Family Medicine | Admitting: Physical Therapy

## 2019-11-14 DIAGNOSIS — M25561 Pain in right knee: Secondary | ICD-10-CM

## 2019-11-14 DIAGNOSIS — M6281 Muscle weakness (generalized): Secondary | ICD-10-CM | POA: Diagnosis not present

## 2019-11-14 DIAGNOSIS — M25562 Pain in left knee: Secondary | ICD-10-CM

## 2019-11-14 NOTE — Therapy (Signed)
[facility] [address] [place], [place], [address] Phone: [phone]   Fax:  [phone]  Physical Therapy Treatment  Patient Details  Name: [facility] MRN: [id number] Date of Birth: Aug 25, 1985 Referring Provider (PT): Dr. Darrow [facility]   Encounter Date: 11/14/2019   PT End of Session - 11/14/19 0853    Visit Number 5    Date for PT Re-Evaluation 11/28/19    Authorization Type BCBS    PT Start Time [phone]    PT Stop Time 0930    PT Time Calculation (min) 39 min    Activity Tolerance Patient tolerated treatment well    Behavior During Therapy [name]           History reviewed. No pertinent past medical history.  History reviewed. No pertinent surgical history.  There were no vitals filed for this visit.   Subjective Assessment - 11/14/19 0855    Subjective I am doing great.    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 No/denies    Multiple Pain Sites No                             [facility] Adult PT Treatment/Exercise - 11/14/19 0001      Knee/Hip Exercises: Stretches   Active Hamstring Stretch Both;3 reps;30 seconds    Gastroc Stretch Both   Bil on slant board 3x 30 sec     Knee/Hip Exercises: Aerobic   Recumbent Bike L4 x 8 min with PT Apresent to discuss progress.      Knee/Hip Exercises: Machines for Strengthening   Total Gym Leg Press Seat 7: eccentrics 65# Bil 20x       Knee/Hip Exercises: Standing   Functional Squat --   Upside down BOSU static squat 3x 30 sec   Other Standing Knee Exercises 20# kettlebell deadlifts 2x15    VC for chest up: thoracic extension                   PT Short Term Goals - 10/17/19 1001      PT SHORT TERM GOAL #1   Title The patient will demonstrate knowledge of basic HEP and concepts to promote healing    Time 4     Period Weeks    Status Achieved    Target Date 10/31/19      PT SHORT TERM GOAL #2   Title The patient will report a 40% improvement in bil anterior knee pain with playing soccer, descending stairs and first thing in the morning    Time 4    Period Weeks    Status --   25%            PT Long Term Goals - 10/03/19 5701      PT LONG TERM GOAL #1   Title The patient will be independent in safe self progression of HEP for further strengthening and best technique for squatting    Time 8    Period Weeks    Status New    Target Date 11/28/19      PT LONG TERM GOAL #2   Title The patient will report a 60% improvement in bil anterior knee pain with playing soccer (stopping,turning), descending stairs and in the early mornings    Time 8  Period Weeks    Status New      PT LONG TERM GOAL #3   Title Trunk, hip abductor and quad strength grossly  4+/5 to 5-/5 needed for soccer (running, stopping, turning)    Time 8    Period Weeks    Status New      PT LONG TERM GOAL #4   Title FOTO functional outcome score improved from 23% limitation to 14%    Time 8    Period Weeks    Status New                 Plan - 11/14/19 0854    Clinical Impression Statement Pt reporting 75% improvement. The stretching "has really helped." Pt demonstrates much improved control at the knees/quads when performing his exercises.    Comorbidities < 1 year post op lumbar surgery    Examination-Activity Limitations Other;Stairs;Squat    Examination-Participation Restrictions Other    Stability/Clinical Decision Making Stable/Uncomplicated    Rehab Potential Good    PT Frequency 1x / week    PT Duration 8 weeks    PT Treatment/Interventions ADLs/Self Care Home Management;Cryotherapy;Electrical Stimulation;Ultrasound;Moist Heat;Iontophoresis 4mg /ml Dexamethasone;Neuromuscular re-education;Therapeutic exercise;Therapeutic activities;Patient/family education;Manual techniques;Dry needling;Taping    PT  Next Visit Plan Take measurements for goals next session. Finalize HEP, pt has 2 more appts    PT Home Exercise Plan [id number]    Consulted and Agree with Plan of Care Patient           Patient will benefit from skilled therapeutic intervention in order to improve the following deficits and impairments:  Pain, Decreased activity tolerance, Decreased strength  Visit Diagnosis: Acute pain of right knee  Acute pain of left knee  Muscle weakness (generalized)     Problem List Patient Active Problem List   Diagnosis Date Noted  . 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 [place], [place], [place] Phone: [phone]   Fax:  [phone]  Name: [facility] MRN: [name] Date of Birth: Jul 05, 1985

## 2019-12-05 ENCOUNTER — Encounter: Payer: Self-pay | Admitting: Physical Therapy

## 2019-12-05 ENCOUNTER — Ambulatory Visit: Payer: BC Managed Care – PPO | Attending: Family Medicine | Admitting: Physical Therapy

## 2019-12-05 ENCOUNTER — Other Ambulatory Visit: Payer: Self-pay

## 2019-12-05 DIAGNOSIS — M25561 Pain in right knee: Secondary | ICD-10-CM | POA: Insufficient documentation

## 2019-12-05 DIAGNOSIS — M6281 Muscle weakness (generalized): Secondary | ICD-10-CM | POA: Insufficient documentation

## 2019-12-05 DIAGNOSIS — M25562 Pain in left knee: Secondary | ICD-10-CM | POA: Insufficient documentation

## 2019-12-05 NOTE — Therapy (Addendum)
[facility] [address], [place], [place], [address] Phone: [phone]   Fax:  [phone]  Physical Therapy Treatment/Recertification/Discharge Summary   Patient Details  Name: Jeremy Hamilton MRN: [id number] Date of Birth: 07-12-1985 Referring Provider (PT): Dr. Lujean Amel   Encounter Date: 12/05/2019   PT End of Session - 12/05/19 0923    Visit Number 6    Date for PT Re-Evaluation 12/05/2019   Authorization Type BC[employer]    PT Start Time 0857   15 min late   PT Stop Time 0925    PT Time Calculation (min) 28 min    Activity Tolerance Patient tolerated treatment well    Behavior During Therapy [facility] for tasks assessed/performed           History reviewed. No pertinent past medical history.  History reviewed. No pertinent surgical history.  There were no vitals filed for this visit.   Subjective Assessment - 12/05/19 0859    Subjective I am ready to be on my own. I am "90% better."    Currently in Pain? No/denies    Multiple Pain Sites No              [facility] PT Assessment - 12/05/19 0001      Assessment   Medical Diagnosis bil patellar tendonitis/bursitis     Referring Provider (PT) Dr. Lauretta Grill Koirala      Observation/Other Assessments   Focus on Therapeutic Outcomes (FOTO)  22% limitation      Strength   Right Hip ABduction 5/5    Left Hip ABduction --   4+/5 - 5/5 slightly weaker on LT   Right Knee Flexion 5/5    Right Knee Extension 5/5    Left Knee Flexion 5/5    Left Knee Extension 5/5                         [facility] Adult PT Treatment/Exercise - 12/05/19 0001      Knee/Hip Exercises: Stretches   Other Knee/Hip Stretches Lt hip IR AROM and static stretching review for HEP      Knee/Hip Exercises: Aerobic   Recumbent Bike L2 x 5 min warm up                     PT Short Term Goals - 10/17/19 1001      PT SHORT TERM GOAL #1   Title The patient will demonstrate  knowledge of basic HEP and concepts to promote healing    Time 4    Period Weeks    Status Achieved    Target Date 10/31/19      PT SHORT TERM GOAL #2   Title The patient will report a 40% improvement in bil anterior knee pain with playing soccer, descending stairs and first thing in the morning    Time 4    Period Weeks    Status --   25%            PT Long Term Goals - 12/05/19 0900      PT LONG TERM GOAL #1   Title The patient will be independent in safe self progression of HEP for further strengthening and best technique for squatting    Time 8    Period Weeks    Status Achieved      PT LONG TERM GOAL #2   Title The patient will report a 60% improvement in bil anterior  knee pain with playing soccer (stopping,turning), descending stairs and in the early mornings    Time 8    Period Weeks    Status Achieved   90%     PT LONG TERM GOAL #3   Title Trunk, hip abductor and quad strength grossly  4+/5 to 5-/5 needed for soccer (running, stopping, turning)    Time 8    Period Weeks    Status Achieved      PT LONG TERM GOAL #4   Title FOTO functional outcome score improved from 23% limitation to 14%    Time 8    Period Weeks    Status Not Met   22% limitation                Plan - 12/05/19 0859    Clinical Impression Statement Pt arrives pain free and reports "90% improvement." MMT all improved since eval, meeting LTG. FOTO is the only goal not met although pt's verbal reports are of significant improvement. Pt is independent in final HEP and will continue to focus on LT hip ROM and flexibility.    Personal Factors and Comorbidities Comorbidity 1    Comorbidities < 1 year post op lumbar surgery    Examination-Activity Limitations Other;Stairs;Squat    Examination-Participation Restrictions Other    Stability/Clinical Decision Making Stable/Uncomplicated    Rehab Potential Good    PT Frequency 1x / week    PT Duration 8 weeks    PT Treatment/Interventions  ADLs/Self Care Home Management;Cryotherapy;Electrical Stimulation;Ultrasound;Moist Heat;Iontophoresis 65m/ml Dexamethasone;Neuromuscular re-education;Therapeutic exercise;Therapeutic activities;Patient/family education;Manual techniques;Dry needling;Taping    PT Next Visit Plan Pt will need to renew this visit and DC.    PT Home Exercise Plan [email or web address]    Consulted and Agree with Plan of Care Patient           Patient will benefit from skilled therapeutic intervention in order to improve the following deficits and impairments:  Pain, Decreased activity tolerance, Decreased strength  Visit Diagnosis: Acute pain of right knee  Acute pain of left knee  Muscle weakness (generalized)  PHYSICAL THERAPY DISCHARGE SUMMARY  Visits from Start of Care: 6  Current functional level related to goals / functional outcomes: See clinical impressions above   Remaining deficits: As above   Education / Equipment: HEP Plan: Patient agrees to discharge.  Patient goals were met. Patient is being discharged due to meeting the stated rehab goals.  ?????       Problem List Patient Active Problem List   Diagnosis Date Noted  . [place]itis [place]is [place] 12/23/2014     SRuben Im PT 12/08/19 2:47 PM Phone: [phone]: [phone] Galas PTA 12/05/19 9:29 AM  12/05/2019, 9:28 AM  [address] [place] [address]Phone: [phone]  Fax:  [phone] Name: AMALIK PAARMRN: [id number] of Birth: 405-08-1985 Access Code: [email or web address]: [email or web address]: 12/03/2021Prepared by: JAnderson MaltaCochranExercises  Gastroc Stretch on Wall - 1 x daily - 7 x weekly - 1 sets - 3 reps - 20 hold  Hip Flexor Stretch at Edge of Bed - 1 x daily - 7 x weekly - 1 sets - 3 reps - 20 hold  Standard Plank - 1 x daily - 7 x weekly - 1 sets - 5 reps - 5 hold  Supine 90/90 Shoulder  Flexion with Abdominal Bracing - 1 x daily - 7 x weekly - 3 sets - 10 reps  Sit to  Stand without Arm Support - 3 x daily - 7 x weekly - 1 sets - 5 reps  Standing Isometric Hip Abduction with Ball on Wall - 1 x daily - 7 x weekly - 1 sets - 10 reps  Squat with Chair Touch and Resistance Loop - 1 x daily - 7 x weekly - 3-5 sets - 1 reps - 45 hold  Side Stepping with Resistance at Thighs - 1 x daily - 7 x weekly - 1 sets - 10 reps  Supine Bilateral Hip Internal Rotation Stretch - 2 x daily - 7 x weekly - 3 sets - 10 reps  Supine Piriformis Stretch with Foot on Ground - 2 x daily - 7 x weekly - 1 sets - 20 reps

## 2019-12-08 NOTE — Addendum Note (Signed)
Addended by: Vivien Presto on: 12/08/2019 02:48 PM   Modules accepted: Orders

## 2019-12-12 ENCOUNTER — Encounter: Payer: Managed Care, Other (non HMO) | Admitting: Physical Therapy

## 2019-12-19 ENCOUNTER — Encounter: Payer: Managed Care, Other (non HMO) | Admitting: Physical Therapy

## 2020-02-13 ENCOUNTER — Other Ambulatory Visit: Payer: Self-pay | Admitting: Orthopaedic Surgery

## 2020-02-18 ENCOUNTER — Encounter (HOSPITAL_BASED_OUTPATIENT_CLINIC_OR_DEPARTMENT_OTHER): Payer: Self-pay | Admitting: Orthopaedic Surgery

## 2020-02-18 ENCOUNTER — Other Ambulatory Visit: Payer: Self-pay

## 2020-02-23 ENCOUNTER — Other Ambulatory Visit (HOSPITAL_COMMUNITY)
Admission: RE | Admit: 2020-02-23 | Discharge: 2020-02-23 | Disposition: A | Discharge status: Home / Self Care | Payer: 59 | Source: Ambulatory Visit | Attending: Orthopaedic Surgery | Admitting: Orthopaedic Surgery

## 2020-02-23 DIAGNOSIS — Z20822 Contact with and (suspected) exposure to covid-19: Secondary | ICD-10-CM | POA: Insufficient documentation

## 2020-02-23 DIAGNOSIS — Z01812 Encounter for preprocedural laboratory examination: Secondary | ICD-10-CM | POA: Insufficient documentation

## 2020-02-23 LAB — SARS CORONAVIRUS 2 (TAT 6-24 HRS): SARS Coronavirus 2: NEGATIVE

## 2020-02-25 NOTE — Progress Notes (Signed)

## 2020-02-26 ENCOUNTER — Ambulatory Visit (HOSPITAL_BASED_OUTPATIENT_CLINIC_OR_DEPARTMENT_OTHER): Payer: 59 | Admitting: Certified Registered Nurse Anesthetist

## 2020-02-26 ENCOUNTER — Encounter (HOSPITAL_BASED_OUTPATIENT_CLINIC_OR_DEPARTMENT_OTHER): Payer: Self-pay | Admitting: Orthopaedic Surgery

## 2020-02-26 ENCOUNTER — Encounter (HOSPITAL_BASED_OUTPATIENT_CLINIC_OR_DEPARTMENT_OTHER)
Admission: RE | Disposition: A | Discharge status: Home / Self Care | Payer: Self-pay | Source: Home / Self Care | Attending: Orthopaedic Surgery

## 2020-02-26 ENCOUNTER — Other Ambulatory Visit: Payer: Self-pay

## 2020-02-26 ENCOUNTER — Ambulatory Visit (HOSPITAL_BASED_OUTPATIENT_CLINIC_OR_DEPARTMENT_OTHER)
Admission: RE | Admit: 2020-02-26 | Discharge: 2020-02-26 | Disposition: A | Discharge status: Home / Self Care | Payer: 59 | Attending: Orthopaedic Surgery | Admitting: Orthopaedic Surgery

## 2020-02-26 DIAGNOSIS — S86312A Strain of muscle(s) and tendon(s) of peroneal muscle group at lower leg level, left leg, initial encounter: Secondary | ICD-10-CM | POA: Diagnosis not present

## 2020-02-26 DIAGNOSIS — M25372 Other instability, left ankle: Secondary | ICD-10-CM | POA: Diagnosis not present

## 2020-02-26 DIAGNOSIS — Z791 Long term (current) use of non-steroidal anti-inflammatories (NSAID): Secondary | ICD-10-CM | POA: Insufficient documentation

## 2020-02-26 DIAGNOSIS — S92152A Displaced avulsion fracture (chip fracture) of left talus, initial encounter for closed fracture: Secondary | ICD-10-CM | POA: Insufficient documentation

## 2020-02-26 DIAGNOSIS — X58XXXA Exposure to other specified factors, initial encounter: Secondary | ICD-10-CM | POA: Insufficient documentation

## 2020-02-26 DIAGNOSIS — M24872 Other specific joint derangements of left ankle, not elsewhere classified: Secondary | ICD-10-CM | POA: Insufficient documentation

## 2020-02-26 DIAGNOSIS — M25872 Other specified joint disorders, left ankle and foot: Secondary | ICD-10-CM | POA: Diagnosis not present

## 2020-02-26 DIAGNOSIS — M216X2 Other acquired deformities of left foot: Secondary | ICD-10-CM | POA: Diagnosis not present

## 2020-02-26 HISTORY — PX: ANKLE ARTHROSCOPY WITH RECONSTRUCTION: SHX5583

## 2020-02-26 SURGERY — ARTHROSCOPY, ANKLE, WITH RECONSTRUCTION
Anesthesia: General | Site: Ankle | Laterality: Left

## 2020-02-26 MED ORDER — DEXAMETHASONE SODIUM PHOSPHATE 10 MG/ML IJ SOLN
INTRAMUSCULAR | Status: AC
  Filled 2020-02-26: qty 1

## 2020-02-26 MED ORDER — FENTANYL CITRATE (PF) 100 MCG/2ML IJ SOLN
INTRAMUSCULAR | Status: AC
  Filled 2020-02-26: qty 2

## 2020-02-26 MED ORDER — CEFAZOLIN SODIUM-DEXTROSE 2-4 GM/100ML-% IV SOLN
INTRAVENOUS | Status: AC
  Filled 2020-02-26: qty 100

## 2020-02-26 MED ORDER — SUGAMMADEX SODIUM 500 MG/5ML IV SOLN
INTRAVENOUS | Status: AC
  Filled 2020-02-26: qty 5

## 2020-02-26 MED ORDER — KETOROLAC TROMETHAMINE 30 MG/ML IJ SOLN
INTRAMUSCULAR | Status: AC
  Filled 2020-02-26: qty 1

## 2020-02-26 MED ORDER — LIDOCAINE 2% (20 MG/ML) 5 ML SYRINGE
INTRAMUSCULAR | Status: AC
  Filled 2020-02-26: qty 5

## 2020-02-26 MED ORDER — ONDANSETRON HCL 4 MG/2ML IJ SOLN
INTRAMUSCULAR | Status: AC
  Filled 2020-02-26: qty 2

## 2020-02-26 MED ORDER — OXYCODONE HCL 5 MG PO TABS: 5.0000 mg | ORAL_TABLET | ORAL | 0 refills | Status: AC | PRN

## 2020-02-26 MED ORDER — LACTATED RINGERS IV SOLN: INTRAVENOUS | Status: DC

## 2020-02-26 MED ORDER — OXYCODONE HCL 5 MG PO TABS
ORAL_TABLET | ORAL | Status: AC
  Filled 2020-02-26: qty 1

## 2020-02-26 MED ORDER — PROPOFOL 10 MG/ML IV BOLUS
INTRAVENOUS | Status: DC | PRN
  Administered 2020-02-26: 200 mg via INTRAVENOUS

## 2020-02-26 MED ORDER — FENTANYL CITRATE (PF) 100 MCG/2ML IJ SOLN
50.0000 ug | Freq: Once | INTRAMUSCULAR | Status: AC
  Administered 2020-02-26: 100 ug via INTRAVENOUS

## 2020-02-26 MED ORDER — KETOROLAC TROMETHAMINE 30 MG/ML IJ SOLN
INTRAMUSCULAR | Status: DC | PRN
  Administered 2020-02-26: 30 mg via INTRAVENOUS

## 2020-02-26 MED ORDER — ONDANSETRON HCL 4 MG/2ML IJ SOLN: 4.0000 mg | Freq: Four times a day (QID) | INTRAMUSCULAR | Status: DC | PRN

## 2020-02-26 MED ORDER — MIDAZOLAM HCL 2 MG/2ML IJ SOLN
1.0000 mg | Freq: Once | INTRAMUSCULAR | Status: AC
  Administered 2020-02-26: 2 mg via INTRAVENOUS

## 2020-02-26 MED ORDER — BUPIVACAINE-EPINEPHRINE (PF) 0.5% -1:200000 IJ SOLN
INTRAMUSCULAR | Status: DC | PRN
  Administered 2020-02-26: 25 mL via PERINEURAL

## 2020-02-26 MED ORDER — OXYCODONE HCL 5 MG PO TABS
5.0000 mg | ORAL_TABLET | Freq: Once | ORAL | Status: AC | PRN
  Administered 2020-02-26: 5 mg via ORAL

## 2020-02-26 MED ORDER — ONDANSETRON HCL 4 MG/2ML IJ SOLN
INTRAMUSCULAR | Status: DC | PRN
  Administered 2020-02-26: 4 mg via INTRAVENOUS

## 2020-02-26 MED ORDER — ROCURONIUM BROMIDE 10 MG/ML (PF) SYRINGE
PREFILLED_SYRINGE | INTRAVENOUS | Status: AC
  Filled 2020-02-26: qty 10

## 2020-02-26 MED ORDER — CEFAZOLIN SODIUM-DEXTROSE 2-4 GM/100ML-% IV SOLN
2.0000 g | INTRAVENOUS | Status: AC
  Administered 2020-02-26: 2 g via INTRAVENOUS

## 2020-02-26 MED ORDER — OXYCODONE HCL 5 MG/5ML PO SOLN: 5.0000 mg | Freq: Once | ORAL | Status: AC | PRN

## 2020-02-26 MED ORDER — PROPOFOL 10 MG/ML IV BOLUS
INTRAVENOUS | Status: AC
  Filled 2020-02-26: qty 20

## 2020-02-26 MED ORDER — DEXAMETHASONE SODIUM PHOSPHATE 10 MG/ML IJ SOLN
INTRAMUSCULAR | Status: DC | PRN
  Administered 2020-02-26: 10 mg via INTRAVENOUS

## 2020-02-26 MED ORDER — FENTANYL CITRATE (PF) 100 MCG/2ML IJ SOLN
25.0000 ug | INTRAMUSCULAR | Status: DC | PRN
  Administered 2020-02-26 (×2): 50 ug via INTRAVENOUS

## 2020-02-26 MED ORDER — FENTANYL CITRATE (PF) 100 MCG/2ML IJ SOLN
INTRAMUSCULAR | Status: DC | PRN
  Administered 2020-02-26 (×4): 25 ug via INTRAVENOUS

## 2020-02-26 MED ORDER — MIDAZOLAM HCL 2 MG/2ML IJ SOLN
INTRAMUSCULAR | Status: AC
  Filled 2020-02-26: qty 2

## 2020-02-26 MED ORDER — LIDOCAINE 2% (20 MG/ML) 5 ML SYRINGE
INTRAMUSCULAR | Status: DC | PRN
  Administered 2020-02-26: 60 mg via INTRAVENOUS

## 2020-02-26 SURGICAL SUPPLY — 78 items
ANCH SUT 3 SUTTAPE 2 LD (Anchor) ×3 IMPLANT
APL PRP STRL LF DISP 70% ISPRP (MISCELLANEOUS) ×2
APL SKNCLS STERI-STRIP NONHPOA (GAUZE/BANDAGES/DRESSINGS)
BANDAGE ESMARK 6X9 LF (GAUZE/BANDAGES/DRESSINGS) IMPLANT
BENZOIN TINCTURE PRP APPL 2/3 (GAUZE/BANDAGES/DRESSINGS) IMPLANT
BLADE SURG 15 STRL LF DISP TIS (BLADE) ×1 IMPLANT
BLADE SURG 15 STRL SS (BLADE) ×4
BNDG CMPR 9X4 STRL LF SNTH (GAUZE/BANDAGES/DRESSINGS)
BNDG CMPR 9X6 STRL LF SNTH (GAUZE/BANDAGES/DRESSINGS)
BNDG ELASTIC 6X5.8 VLCR STR LF (GAUZE/BANDAGES/DRESSINGS) ×4 IMPLANT
BNDG ESMARK 4X9 LF (GAUZE/BANDAGES/DRESSINGS) IMPLANT
BNDG ESMARK 6X9 LF (GAUZE/BANDAGES/DRESSINGS)
BUR PEAR (BURR) ×1 IMPLANT
CHLORAPREP W/TINT 26 (MISCELLANEOUS) ×4 IMPLANT
COVER WAND RF STERILE (DRAPES) IMPLANT
CUFF TOURN SGL QUICK 34 (TOURNIQUET CUFF) ×2
CUFF TRNQT CYL 34X4.125X (TOURNIQUET CUFF) ×1 IMPLANT
DECANTER SPIKE VIAL GLASS SM (MISCELLANEOUS) IMPLANT
DRAPE EXTREMITY T 121X128X90 (DISPOSABLE) ×2 IMPLANT
DRAPE OEC MINIVIEW 54X84 (DRAPES) ×1 IMPLANT
DRAPE U-SHAPE 47X51 STRL (DRAPES) ×2 IMPLANT
DRSG PAD ABDOMINAL 8X10 ST (GAUZE/BANDAGES/DRESSINGS) ×2 IMPLANT
ELECT REM PT RETURN 9FT ADLT (ELECTROSURGICAL) ×2
ELECTRODE REM PT RTRN 9FT ADLT (ELECTROSURGICAL) IMPLANT
EXCALIBUR 3.8MM X 13CM (MISCELLANEOUS) ×2 IMPLANT
GAUZE SPONGE 4X4 12PLY STRL (GAUZE/BANDAGES/DRESSINGS) ×2 IMPLANT
GAUZE XEROFORM 1X8 LF (GAUZE/BANDAGES/DRESSINGS) ×2 IMPLANT
GLOVE SRG 8 PF TXTR STRL LF DI (GLOVE) ×1 IMPLANT
GLOVE SURG ENC TEXT LTX SZ7.5 (GLOVE) ×4 IMPLANT
GLOVE SURG UNDER POLY LF SZ8 (GLOVE) ×2
GOWN STRL REUS W/ TWL LRG LVL3 (GOWN DISPOSABLE) ×1 IMPLANT
GOWN STRL REUS W/ TWL XL LVL3 (GOWN DISPOSABLE) ×1 IMPLANT
GOWN STRL REUS W/TWL LRG LVL3 (GOWN DISPOSABLE) ×2
GOWN STRL REUS W/TWL XL LVL3 (GOWN DISPOSABLE) ×4
KIT SUTURETAK 2.4 DRILL BIT (KITS) ×1 IMPLANT
KIT SUTURETAK 3 SPEAR TROCAR (KITS) IMPLANT
MANIFOLD NEPTUNE II (INSTRUMENTS) ×2 IMPLANT
NDL KEITH (NEEDLE) IMPLANT
NDL SUT 6 .5 CRC .975X.05 MAYO (NEEDLE) IMPLANT
NEEDLE KEITH (NEEDLE) IMPLANT
NEEDLE MAYO TAPER (NEEDLE)
NS IRRIG 1000ML POUR BTL (IV SOLUTION) ×2 IMPLANT
PACK ARTHROSCOPY DSU (CUSTOM PROCEDURE TRAY) ×2 IMPLANT
PACK BASIN DAY SURGERY FS (CUSTOM PROCEDURE TRAY) ×2 IMPLANT
PADDING CAST SYNTHETIC 4 (CAST SUPPLIES) ×2
PADDING CAST SYNTHETIC 4X4 STR (CAST SUPPLIES) ×2 IMPLANT
PENCIL SMOKE EVACUATOR (MISCELLANEOUS) IMPLANT
SHAVER DISSECTOR 3.0 (BURR) IMPLANT
SHAVER SABRE 2.0 (BURR) IMPLANT
SHEET MEDIUM DRAPE 40X70 STRL (DRAPES) ×2 IMPLANT
SLEEVE SCD COMPRESS KNEE MED (MISCELLANEOUS) ×2 IMPLANT
SPLINT FAST PLASTER 5X30 (CAST SUPPLIES)
SPLINT PLASTER CAST FAST 5X30 (CAST SUPPLIES) IMPLANT
SPONGE LAP 18X18 RF (DISPOSABLE) ×1 IMPLANT
STOCKINETTE 6  STRL (DRAPES) ×2
STOCKINETTE 6 STRL (DRAPES) ×1 IMPLANT
STRAP ANKLE FOOT DISTRACTOR (ORTHOPEDIC SUPPLIES) ×2 IMPLANT
STRIP CLOSURE SKIN 1/2X4 (GAUZE/BANDAGES/DRESSINGS) IMPLANT
SUCTION FRAZIER HANDLE 10FR (MISCELLANEOUS) ×2
SUCTION TUBE FRAZIER 10FR DISP (MISCELLANEOUS) ×1 IMPLANT
SUT BONE WAX W31G (SUTURE) ×2 IMPLANT
SUT ETHILON 3 0 PS 1 (SUTURE) ×2 IMPLANT
SUT FIBERWIRE #2 38 T-5 BLUE (SUTURE)
SUT FIBERWIRE 2-0 18 17.9 3/8 (SUTURE)
SUT MNCRL AB 3-0 PS2 18 (SUTURE) ×3 IMPLANT
SUT PDS AB 2-0 CT2 27 (SUTURE) ×2 IMPLANT
SUT VIC AB 0 CT1 27 (SUTURE) ×2
SUT VIC AB 0 CT1 27XBRD ANBCTR (SUTURE) IMPLANT
SUT VIC AB 0 SH 27 (SUTURE) ×1 IMPLANT
SUT VIC AB 3-0 FS2 27 (SUTURE) IMPLANT
SUTURE FIBERWR #2 38 T-5 BLUE (SUTURE) IMPLANT
SUTURE FIBERWR 2-0 18 17.9 3/8 (SUTURE) IMPLANT
SUTURE TAPE 3.0 DBL LOAD S-TAK (Anchor) IMPLANT
SUTURETAPE 3.0 DBL LOAD S-TAK (Anchor) ×6 IMPLANT
SYR BULB EAR ULCER 3OZ GRN STR (SYRINGE) IMPLANT
TOWEL GREEN STERILE FF (TOWEL DISPOSABLE) ×4 IMPLANT
TUBING ARTHROSCOPY IRRIG 16FT (MISCELLANEOUS) ×2 IMPLANT
UNDERPAD 30X36 HEAVY ABSORB (UNDERPADS AND DIAPERS) ×2 IMPLANT

## 2020-02-26 NOTE — Brief Op Note (Signed)
02/26/2020  1:35 PM  PATIENT:  Jeremy Hamilton  36 y.o. male  PRE-OPERATIVE DIAGNOSIS:  LEFT ANKLE ARTHRITIS, ANKLE INSTABILITY, ANKLE IMPINGEMENT, OSTEOCHONDRAL LESION OF TALUS, PERONEAL TENDON TEAR, PERONEAL TENDON SUBLUXATION  POST-OPERATIVE DIAGNOSIS:  LEFT ANKLE ARTHRITIS, ANKLE INSTABILITY, ANKLE IMPINGEMENT,   PROCEDURE:  Procedure(s) with comments: LEFT ANKLE ARTHROSCOPIC DEBRIDEMENT, ARTHROSCOPIC TREATMENT OF TALUS OSTEOCHONDRAL LESION, LATERAL LIGAMENT RECONSTRUCTION, PERONEAL TENDON DEBRIDEMENT, REPAIR DISLOCATED PERONEAL TENDONS WITH FIBULA DEEPENING (Left) - LENGTH OF SURGERY: 2HOURS  Partial resection of tibia Partial resection of fibula Deltoid ligament reconstructions  SURGEON:  Surgeon(s) and Role:    Terance Hart, MD - Primary  PHYSICIAN ASSISTANT:   ASSISTANTS: none   ANESTHESIA:   general  EBL:  10 mL   BLOOD ADMINISTERED:none  DRAINS: none   LOCAL MEDICATIONS USED:  NONE  SPECIMEN:  No Specimen  DISPOSITION OF SPECIMEN:  N/A  COUNTS:  YES  TOURNIQUET:   Total Tourniquet Time Documented: Thigh (Left) - 120 minutes Total: Thigh (Left) - 120 minutes   DICTATION: .Dragon Dictation  PLAN OF CARE: Discharge to home after PACU  PATIENT DISPOSITION:  PACU - hemodynamically stable.   Delay start of Pharmacological VTE agent (>24hrs) due to surgical blood loss or risk of bleeding: not applicable

## 2020-02-26 NOTE — Transfer of Care (Signed)
Immediate Anesthesia Transfer of Care Note  Patient: Jeremy Hamilton  Procedure(s) Performed: LEFT ANKLE ARTHROSCOPIC DEBRIDEMENT, ARTHROSCOPIC TREATMENT OF TALUS OSTEOCHONDRAL LESION, LATERAL LIGAMENT RECONSTRUCTION, PERONEAL TENDON DEBRIDEMENT, REPAIR DISLOCATED PERONEAL TENDONS WITH FIBULA DEEPENING (Left Ankle)  Patient Location: PACU  Anesthesia Type:GA combined with regional for post-op pain  Level of Consciousness: awake, alert  and oriented  Airway & Oxygen Therapy: Patient Spontanous Breathing and Patient connected to face mask oxygen  Post-op Assessment: Report given to RN and Post -op Vital signs reviewed and stable  Post vital signs: Reviewed and stable  Last Vitals:  Vitals Value Taken Time  BP 136/90 02/26/20 1322  Temp    Pulse 100 02/26/20 1323  Resp 18 02/26/20 1323  SpO2 100 % 02/26/20 1323  Vitals shown include unvalidated device data.  Last Pain:  Vitals:   02/26/20 0924  TempSrc: Oral  PainSc: 8       Patients Stated Pain Goal: 6 (02/26/20 0924)  Complications: No complications documented.

## 2020-02-26 NOTE — Progress Notes (Signed)
Assisted Dr. Hodierne with left, ultrasound guided, popliteal block. Side rails up, monitors on throughout procedure. See vital signs in flow sheet. Tolerated Procedure well. 

## 2020-02-26 NOTE — H&P (Signed)
PREOPERATIVE H&P  Chief Complaint: Left ankle pain  HPI: Jeremy Hamilton is a 35 y.o. male who presents for preoperative history and physical with a diagnosis of left ankle instability and peroneal tendon dislocation with ankle synovitis and impingement.  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  He is here today for surgical correction of his ankle. Symptoms are rated as moderate to severe, and have been worsening.  This is significantly impairing activities of daily living.  He has elected for surgical management.   History reviewed. No pertinent past medical history. Past Surgical History:  Procedure Laterality Date  . SYNOVIAL CYST EXCISION     Social History   Socioeconomic History  . Marital status: Single    Spouse name: Not on file  . Number of children: Not on file  . Years of education: Not on file  . Highest education level: Not on file  Occupational History  . Not on file  Tobacco Use  . Smoking status: Never Smoker  . Smokeless tobacco: Never Used  Substance and Sexual Activity  . Alcohol use: No  . Drug use: No  . Sexual activity: Not on file  Other Topics Concern  . Not on file  Social History Narrative  . Not on file   Social Determinants of Health   Financial Resource Strain: Not on file  Food Insecurity: Not on file  Transportation Needs: Not on file  Physical Activity: Not on file  Stress: Not on file  Social Connections: Not on file   History reviewed. No pertinent family history. No Known Allergies Prior to Admission medications   Medication Sig Start Date End Date Taking? Authorizing Provider  ibuprofen (ADVIL,MOTRIN) 200 MG tablet Take 800 mg by mouth daily as needed for mild pain or moderate pain.   Yes [provider]  MELOXICAM PO Take by mouth.   Yes [provider]  traMADol (ULTRAM) 50 MG tablet  Take 50 mg by mouth every 6 (six) hours as needed.    [provider]     Positive ROS: All other systems have been reviewed and were otherwise negative with the exception of those mentioned in the HPI and as above.  Physical Exam:  Vitals:   02/26/20 0924  BP: 126/87  Pulse: 67  Resp: 16  Temp: 98.4 F (36.9 C)  SpO2: 98%   General: Alert, no acute distress Cardiovascular: No pedal edema Respiratory: No cyanosis, no use of accessory musculature GI: No organomegaly, abdomen is soft and non-tender Skin: No lesions in the area of chief complaint Neurologic: Sensation intact distally Psychiatric: Patient is competent for consent with normal mood and affect Lymphatic: No axillary or cervical lymphadenopathy  MUSCULOSKELETAL: Left ankle demonstrates swelling medially and laterally.  He has tenderness palpation over the medial ankle over the deltoid ligament complex as well as anterior about the ankle and laterally about the ankle.  He has some peroneal tendon subluxation notable.  There is thickening along the tendons.  He has pain with hindfoot eversion.  Ankle is unstable with anterior drawer testing.  Distally sensation grossly intact.  Foot is warm and well-perfused.  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   Plan: Plan for arthroscopic ankle debridement with treatment of osteochondral lesion as needed.  We will  then plan for peroneal tendon repair and fibular groove deepening with lateral ligament reconstruction.  We will then the medial and remove the bony fragmentation and repair of the deltoid ligaments.  Postoperatively he will be nonweightbearing.  We discussed the risks, benefits and alternatives of surgery which include but are not limited to wound healing complications, infection, nonunion, malunion, need for further surgery, damage to  surrounding structures and continued pain.  They understand there is no guarantees to an acceptable outcome.  After weighing these risks they opted to proceed with surgery.     Terance Hart, MD    02/26/2020 10:19 AM

## 2020-02-26 NOTE — Anesthesia Postprocedure Evaluation (Signed)
Anesthesia Post Note  Patient: Jeremy Hamilton  Procedure(s) Performed: LEFT ANKLE ARTHROSCOPIC DEBRIDEMENT, ARTHROSCOPIC TREATMENT OF TALUS OSTEOCHONDRAL LESION, LATERAL LIGAMENT RECONSTRUCTION, PERONEAL TENDON DEBRIDEMENT, REPAIR DISLOCATED PERONEAL TENDONS WITH FIBULA DEEPENING (Left Ankle)     Patient location during evaluation: PACU Anesthesia Type: General Level of consciousness: awake and alert Pain management: pain level controlled Vital Signs Assessment: post-procedure vital signs reviewed and stable Respiratory status: spontaneous breathing, nonlabored ventilation, respiratory function stable and patient connected to nasal cannula oxygen Cardiovascular status: blood pressure returned to baseline and stable Postop Assessment: no apparent nausea or vomiting Anesthetic complications: no   No complications documented.  Last Vitals:  Vitals:   02/26/20 1345 02/26/20 1425  BP: (!) 143/89 (!) 146/85  Pulse: (!) 102 99  Resp: 20 16  Temp:  36.4 C  SpO2: 100% 100%    Last Pain:  Vitals:   02/26/20 1429  TempSrc:   PainSc: Asleep                 Myron Lona S

## 2020-02-26 NOTE — Anesthesia Procedure Notes (Signed)
Procedure Name: LMA Insertion Date/Time: 02/26/2020 10:32 AM Performed by: Pearson Grippe, CRNA Pre-anesthesia Checklist: Patient identified, Emergency Drugs available, Suction available and Patient being monitored Patient Re-evaluated:Patient Re-evaluated prior to induction Oxygen Delivery Method: Circle system utilized Preoxygenation: Pre-oxygenation with 100% oxygen Induction Type: IV induction Ventilation: Mask ventilation without difficulty LMA: LMA inserted LMA Size: 4.0 Number of attempts: 1 Airway Equipment and Method: Bite block Placement Confirmation: positive ETCO2 Tube secured with: Tape Dental Injury: Teeth and Oropharynx as per pre-operative assessment

## 2020-02-26 NOTE — Discharge Instructions (Signed)
DR. ADA[facility] POST-OP INSTRUCTIONS   Pain Management 1. The numbing medicine and your leg will last around 18 hours, take a dose of your pain medicine as soon as you feel it wearing off to avoid rebound pain. 2. Keep your foot elevated above heart level.  Make sure that your heel hangs free ('floats'). 3. Take all prescribed medication as directed. 4. If taking narcotic pain medication you may want to use an over-the-counter stool softener to avoid constipation. 5. You may take over-the-counter NSAIDs (ibuprofen, naproxen, etc.) as well as over-the-counter acetaminophen as directed on the packaging as a supplement for your pain and may also use it to wean away from the prescription medication.  Activity ? Non-weightbearing ? Keep splint intact  First Postoperative Visit 1. Your first postop visit will be at least 2 weeks after surgery.  This should be scheduled when you schedule surgery. 2. If you do not have a postoperative visit scheduled please call [phone] to schedule an appointment. 3. At the appointment your incision will be evaluated for suture removal, x-rays will be obtained if necessary.  General Instructions 1. Swelling is very common after foot and ankle surgery.  It often takes 3 months for the foot and ankle to begin to feel comfortable.  Some amount of swelling will persist for 6-12 months. 2. DO NOT change the dressing.  If there is a problem with the dressing (too tight, loose, gets wet, etc.) please contact Dr. Adair's office. 3. DO NOT get the dressing wet.  For showers you can use an over-the-counter cast cover or wrap a washcloth around the top of your dressing and then cover it with a plastic bag and tape it to your leg. 4. DO NOT soak the incision (no tubs, pools, bath, etc.) until you have approval from Dr. Adair.  Contact Dr. Adairs office or go to Emergency Room if: 1. Temperature above 101 F. 2. Increasing pain that is unresponsive to pain  medication or elevation 3. Excessive redness or swelling in your foot 4. Dressing problems - excessive bloody drainage, looseness or tightness, or if dressing gets wet 5. Develop pain, swelling, warmth, or discoloration of your calf   Post Anesthesia Home Care Instructions  Activity: Get plenty of rest for the remainder of the day. A responsible individual must stay with you for 24 hours following the procedure.  For the next 24 hours, DO NOT: -Drive a car -Operate machinery -Drink alcoholic beverages -Take any medication unless instructed by your physician -Make any legal decisions or sign important papers.  Meals: Start with liquid foods such as gelatin or soup. Progress to regular foods as tolerated. Avoid greasy, spicy, heavy foods. If nausea and/or vomiting occur, drink only clear liquids until the nausea and/or vomiting subsides. Call your physician if vomiting continues.  Special Instructions/Symptoms: Your throat may feel dry or sore from the anesthesia or the breathing tube placed in your throat during surgery. If this causes discomfort, gargle with warm salt water. The discomfort should disappear within 24 hours.  If you had a scopolamine patch placed behind your ear for the management of post- operative nausea and/or vomiting:  1. The medication in the patch is effective for 72 hours, after which it should be removed.  Wrap patch in a tissue and discard in the trash. Wash hands thoroughly with soap and water. 2. You may remove the patch earlier than 72 hours if you experience unpleasant side effects which may include dry mouth, dizziness   dizziness or visual disturbances. 3. Avoid touching the patch. Wash your hands with soap and water after contact with the patch.     Regional Anesthesia Blocks  1. Numbness or the inability to move the "blocked" extremity may last from 3-48 hours after placement. The length of time depends on the medication injected and your individual response to  the medication. If the numbness is not going away after 48 hours, call your surgeon.  2. The extremity that is blocked will need to be protected until the numbness is gone and the  Strength has returned. Because you cannot feel it, you will need to take extra care to avoid injury. Because it may be weak, you may have difficulty moving it or using it. You may not know what position it is in without looking at it while the block is in effect.  3. For blocks in the legs and feet, returning to weight bearing and walking needs to be done carefully. You will need to wait until the numbness is entirely gone and the strength has returned. You should be able to move your leg and foot normally before you try and bear weight or walk. You will need someone to be with you when you first try to ensure you do not fall and possibly risk injury.  4. Bruising and tenderness at the needle site are common side effects and will resolve in a few days.  5. Persistent numbness or new problems with movement should be communicated to the surgeon or the [facility] [phone] [facility] [phone]).  No ibuprofen until after 7pm if needed.

## 2020-02-26 NOTE — Anesthesia Preprocedure Evaluation (Signed)
Anesthesia Evaluation  Patient identified by MRN, date of birth, ID band Patient awake    Reviewed: Allergy & Precautions, H&P , NPO status , Patient's Chart, lab work & pertinent test results  Airway Mallampati: II   Neck ROM: full    Dental   Pulmonary neg pulmonary ROS,    breath sounds clear to auscultation       Cardiovascular negative cardio ROS   Rhythm:regular Rate:Normal     Neuro/Psych    GI/Hepatic   Endo/Other    Renal/GU      Musculoskeletal  (+) Arthritis ,   Abdominal   Peds  Hematology   Anesthesia Other Findings   Reproductive/Obstetrics                             Anesthesia Physical Anesthesia Plan  ASA: II  Anesthesia Plan: General   Post-op Pain Management:  Regional for Post-op pain   Induction: Intravenous  PONV Risk Score and Plan: 2 and Ondansetron, Dexamethasone, Midazolam and Treatment may vary due to age or medical condition  Airway Management Planned: Oral ETT  Additional Equipment:   Intra-op Plan:   Post-operative Plan: Extubation in OR  Informed Consent: I have reviewed the patients History and Physical, chart, labs and discussed the procedure including the risks, benefits and alternatives for the proposed anesthesia with the patient or authorized representative who has indicated his/her understanding and acceptance.     Dental advisory given  Plan Discussed with: CRNA, Anesthesiologist and Surgeon  Anesthesia Plan Comments:         Anesthesia Quick Evaluation

## 2020-02-26 NOTE — Anesthesia Procedure Notes (Signed)
Anesthesia Regional Block: Popliteal block   Pre-Anesthetic Checklist: ,, timeout performed, Correct Patient, Correct Site, Correct Laterality, Correct Procedure, Correct Position, site marked, Risks and benefits discussed,  Surgical consent,  Pre-op evaluation,  At surgeon's request and post-op pain management  Laterality: Left  Prep: chloraprep       Needles:  Injection technique: Single-shot  Needle Type: Echogenic Stimulator Needle          Additional Needles:   Procedures:, nerve stimulator,,,,,,,   Nerve Stimulator or Paresthesia:  Response: plantar flexion of foot, 0.45 mA,   Additional Responses:   Narrative:  Start time: 02/26/2020 10:04 AM End time: 02/26/2020 10:17 AM Injection made incrementally with aspirations every 5 mL.  Performed by: Personally  Anesthesiologist: Achille Rich, MD  Additional Notes: Functioning IV was confirmed and monitors were applied.  A 57mm 21ga Arrow echogenic stimulator needle was used. Sterile prep and drape,hand hygiene and sterile gloves were used.  Negative aspiration and negative test dose prior to incremental administration of local anesthetic. The patient tolerated the procedure well.  Ultrasound guidance: relevent anatomy identified, needle position confirmed, local anesthetic spread visualized around nerve(s), vascular puncture avoided.  Image printed for medical record.

## 2020-02-27 ENCOUNTER — Encounter (HOSPITAL_BASED_OUTPATIENT_CLINIC_OR_DEPARTMENT_OTHER): Payer: Self-pay | Admitting: Orthopaedic Surgery

## 2020-03-03 NOTE — Op Note (Signed)
Jeremy Hamilton male 35 y.o. 02/26/2020   PreOperative Diagnosis: Left ankle instability Left ankle impingement Peroneus brevis tendon tear Dislocated peroneal tendons Distal tibial avulsion fracture with nonunion   PostOperative Diagnosis: Same  PROCEDURE: Left ankle arthroscopic debridement, extensive Arthroscopic treatment of talus osteochondral lesion Lateral ligament reconstruction Repair of peroneus brevis tendon Repair of dislocated peroneal tendons with fibular groove deepening Partial resection of tibia Deltoid ligament reconstruction   SURGEON: Dub Mikes, MD  ASSISTANT: None  ANESTHESIA: General with peripheral nerve blockade  FINDINGS: See below  IMPLANTS: Arthrex suture tack  INDICATIONS:34 y.o. male sustained a significant ankle injury in the summer 2021.  He did not seek much treatment but had continued pain and was found to have gross instability on examination.  MRI scan demonstrated disruption of his ligaments with dislocated peroneal tendons and incompetent superior peroneal retinaculum.  There is also concern for osteochondral lesion.  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  He had pain along the lateral and medial aspect of his ankle and also had weakness and instability noted.  Patient was indicated for surgery due to failure of conservative treatment and exam and imaging findings   patient understood the risks, benefits and alternatives to surgery which include but are not limited to wound healing complications, infection, nonunion, malunion, need for further surgery as well as damage to surrounding structures. They also understood the potential for continued pain in that there were no guarantees of acceptable outcome After weighing these risks the patient opted to proceed with surgery.  PROCEDURE:  Patient was identified in the preoperative  holding area.  The  left leg was marked myself. The consent was signed myself and the patient.  Peripheral nerve blockade was performed by anesthesia. Patient was taken the operative suite placed supine operative table.  Thigh tourniquet was placed on the operative thigh after general LMA anesthesia was induced without difficulty.  Preoperative antibiotics were given.  A bump was placed on the left hip and all bony prominences were well-padded.  The operative extremity was prepped and draped in the usual sterile fashion.  Surgical timeout was performed.  We began by elevating the limb and inflating the tourniquet to 250 mmHg.  The ankle distractor was placed.   We began by insufflating the ankle joint using an 18-gauge needle and a medial injection site.  We then began by making an anteromedial portal to the ankle joint.  This was done with an 11 blade through the skin.  Then blunt dissection was used with a hemostat down to the capsule.  Then the capsule was violated and the joint entered.  Then the trocar with the camera was placed.  There was a large amount of synovitis within the ankle joint.   Then a lateral portal was placed in a similar fashion.  The probe was placed to remove the synovitic tissue and the joint surfaces were inspected.    Upon inspection of the joint there was found to be a significant amount of synovitic tissue and cartilage wear on the anterior distal aspect of the tibia as well as anterior talus.  There was chondral wear and some partial-thickness chondrosis about the ankle joint.  There was also notable osteochondral lesion of the talar dome.  Then the probe was removed and the shaver was inserted.  Extensive debridement was done of the ankle joint including synovial tissue the loose  bony fragments and osteophyte on the anterior distal tibial  plafond.    The loose portions of the cartilage were debrided extensively with the shaver.  All portions of the loose cartilage that were unable  to be adequately debrided with a shaver were debrided with a curette used back to a stable cartilage base.  The anterior talus and tibial plafond were both extensively debrided with a shaver.  There was a notable full-thickness osteochondral defect of the lateral talar dome and shoulder.   The loose cartilage fragments were removed with a curette.    The cartilage was removed back to a stable base surrounding the lesion.  The lesion was approximately 4 x 5 mm.  Then using a microfracture pick microfracture was performed of this area treating the osteochondral defect.  There was evidence of bony bleeding at the site of microfracture. The joint surfaces were inspected for any evidence of loose cartilage.  There was some remaining chondrosis notably within the lateral gutter and of the lateral anterior tibial plafond.  This was debrided extensively. This completed the arthroscopic debridement portion of the case.   We then turned our attention to the lateral ankle.  There was notable dislocation of the peroneal tendons through palpation.  An incision was made along the posterior aspect of the fibula in line with the peroneal tendons and then in a curvilinear fashion distal to the fibula.  The incision was taken sharply down through skin and subcutaneous tissue.  Skin flaps were mobilized.  The dislocated peroneal tendons were identified.  The sheath of the peroneal tendons was incised for short segment to gain access to the sheath.  Then dissection scissors were used to open up the sheath in line with the tendons.  There is notable scarring and dislocation of the tendons.  The superior peroneal retinaculum had avulsed from the lateral aspect of the fibula and the tendons were sitting directly on the side of the fibula.  The tendons were inspected and there is found to be tearing of the peroneus brevis tendon.  There was adhesions between the peroneus brevis and longus tendon.  The tendon adhesions were excisionally  debrided and the synovitic and scar tissue was discarded.  There was significant amount of scarring within the actual retrofibular groove that was removed sharply with a 15 blade and dissection scissors and excised.  It was notable that there was a shallow fibular groove.  Then the peroneus brevis tendon was repaired using a 2-0 FiberWire suture in a tubularization and running suture repair technique.    We then turned our attention to the lateral ligament complex.  A separate deep incision was made about the anterolateral ankle joint near the site of the lateral ligament complex.   Subcuticular fat was mobilized anteriorly and posteriorly.  Then the extensor retinacular tissue was identified as well as as the distal fibula.  An incision was made through the remaining lateral ligament tissue at the tip of the fibula in a curvilinear fashion.  Then the tissue was inspected and found to be incompetent.  The distal aspect of the fibula was cleared back to a bleeding surface using a rondure and a knife.  Once adequate surface was obtained we placed 2 Arthrex suture tacks within the distal fibula.  The ligaments were then repaired by tying down the suture anchors.  Then the ankle was assessed for stability and found to be stable.  We then turned our attention back to the peroneal tendons and dislocation.  We remove scar tissue from the lateral aspect of the fibula at the avulsion site of the superior peroneal retinaculum.  The tissue was competent.  The distal aspect of the fibula was then inspected and found to be very shallow with respect to the retrofibular groove.  A bur was used to deepen the groove of the distal fibula.  This was done through the cortical bone and resulted in exposed cancellous bone.  Bone wax was placed within the cancellous surface of the posterior distal fibula.  The tendons were then relocated and found to be well-seated within the retrofibular groove.  The lateral posterior aspect of the  fibula was then drilled for superior peroneal retinacular repair purposes.  Using a FiberWire suture the posterior aspect of the SPR was repaired back to the fibula through the drill tunnels.  The tendons were able to glide well through the area.  We then turned our attention to the medial ankle.  A separate incision was made overlying the medial malleolus at the site of his prior deltoid avulsion injury.  This was taken sharply down through skin and subcutaneous tissue.  Blunt dissection was used to gain access to the medial malleolus.  Through direct palpation there was a large nonunited bony fragment from the medial malleolus.  This was able to be visualized through the arthroscope previously.  Then the tissue was mobilized around this fragment and resection of this portion of the tibia was performed.  The bony material was discarded.  There was incompetence of the deltoid ligament complex in this area and gross instability with manual talar tilting.  The distal aspect of the tibia was then roughened up and the scar tissue was discarded.  A suture tack was placed within the medial malleolus and the deltoid ligament tissue was reconstructed back to the bony surface of the medial malleolus.    The wounds were irrigated copiously with normal saline.  The tourniquet was released and hemostasis was obtained.  The deep tissues were closed with a 3-0 Monocryl and the skin with a 3-0 nylon.  The portals were closed with a 3-0 nylon.    The leg was cleaned and the wounds were covered with Xeroform and a soft dressing.   A nonweightbearing short leg splint was placed.  They were awakened from anesthesia and taken recovery in stable condition.  All counts were correct at the end the case. There were no complications.   POST OPERATIVE INSTRUCTIONS: Keep splint dry and in place Nonweightbearing to left lower extremity Call the office with concerns Follow-up in 2 weeks for splint removal, suture removal and  likely placement of a nonweightbearing cast. No need for DVT prophylaxis.  TOURNIQUET TIME: Less than 2 hours  BLOOD LOSS:  Minimal         DRAINS: none         SPECIMEN: none       COMPLICATIONS:  * No complications entered in OR log *         Disposition: PACU - hemodynamically stable.         Condition: stable

## 2021-04-05 IMAGING — MR MR LUMBAR SPINE W/O CM
4 series · 18 of 48 positions shown · non-contrast
Comparison: Previous MRI from 04/01/2014.

CLINICAL DATA: Initial evaluation for severe right lower back pain
with right hip and leg pain extending to the foot for 2 months.

EXAM:
MRI LUMBAR SPINE WITHOUT CONTRAST
TECHNIQUE: Multiplanar, multisequence MR imaging of the lumbar spine was
performed. No intravenous contrast was administered.

[Series 6: T2 · sagittal · 4.0mm · 0.73mm/px · 6 of 15 slices shown (1 of 2)]
[im 1/15]
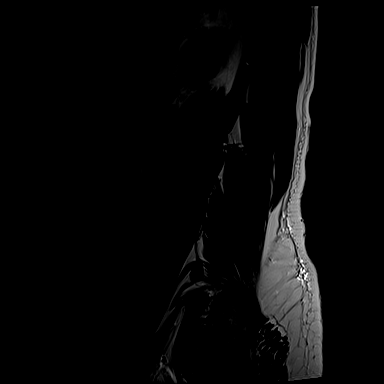
[im 3/15]
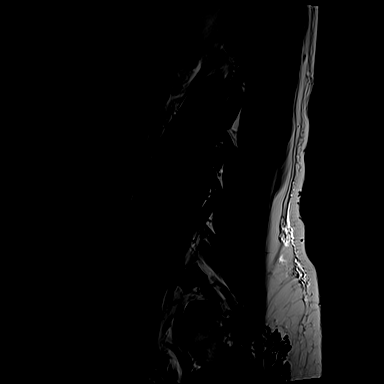
[im 6/15]
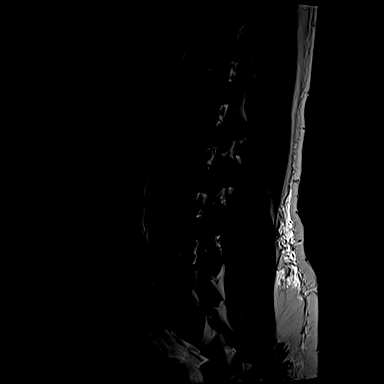
[im 9/15]
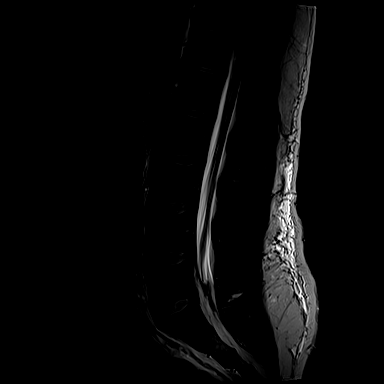
[im 12/15]
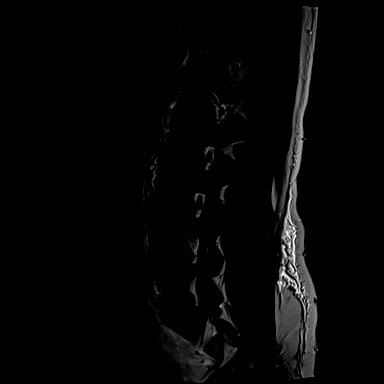
[im 15/15]
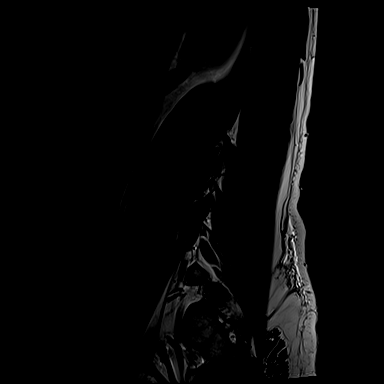

[Series 7: T1 · sagittal · 4.0mm · 0.73mm/px · 3 of 15 slices shown (1 of 2)]
[im 3/15]
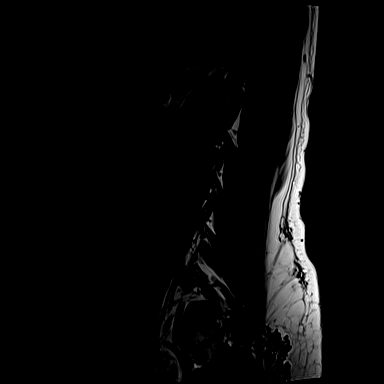
[im 9/15]
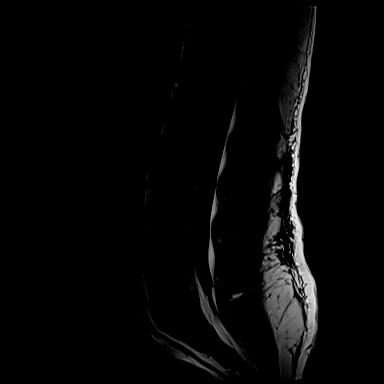
[im 15/15]
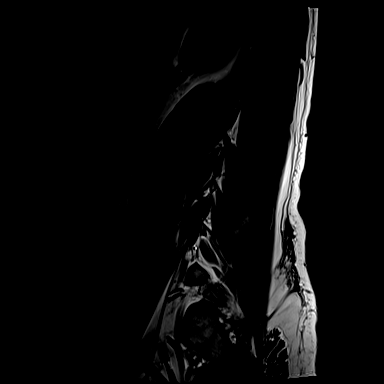

[Series 10: T1 · axial · 4.0mm · 0.28mm/px · z∈[-80,+80]mm · 3 of 42 slices shown (2 of 2)]
[im 8/42]
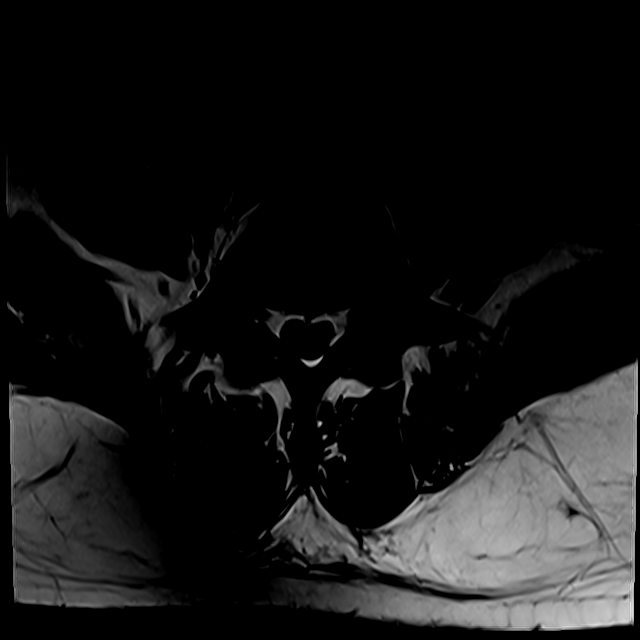
[im 22/42]
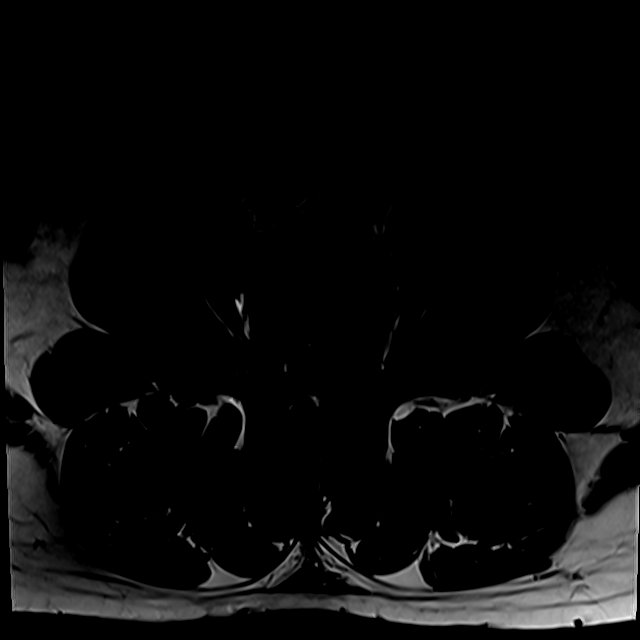
[im 37/42]
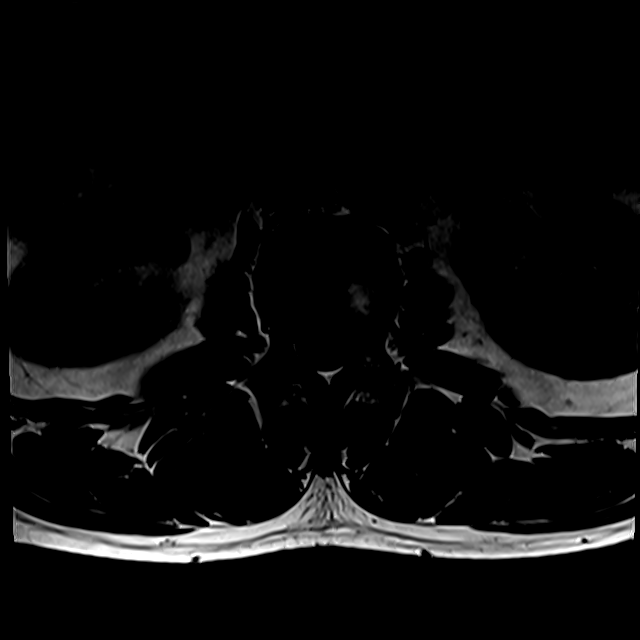

[Series 13: T2 · axial · 4.0mm · 0.28mm/px · z∈[-105,+80]mm · 6 of 42 slices shown (2 of 2)]
[im 3/42]
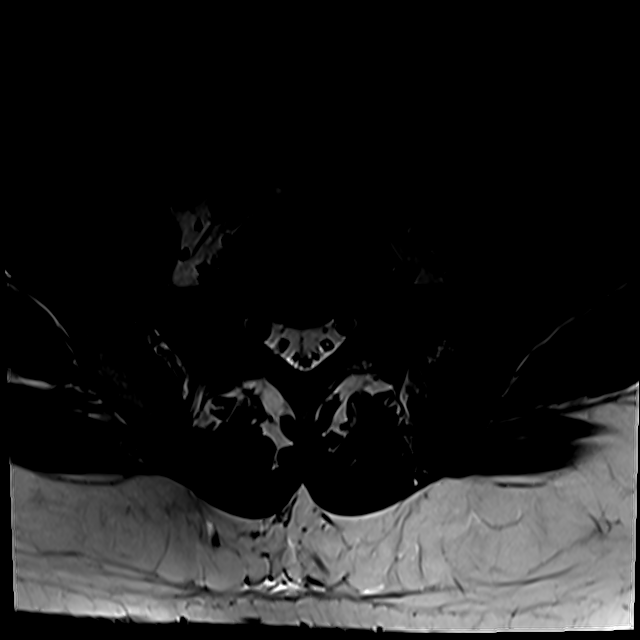
[im 8/42]
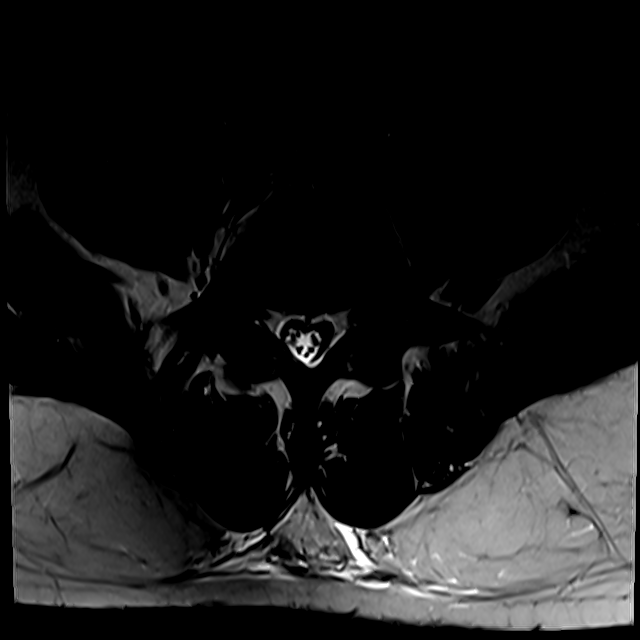
[im 13/42]
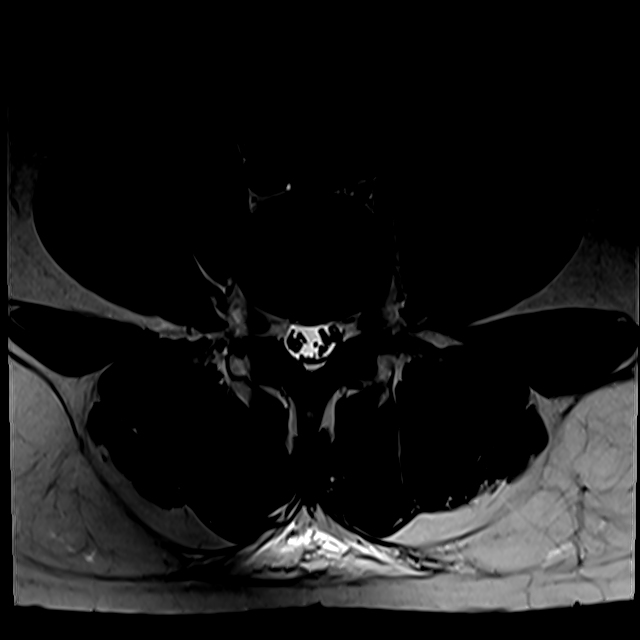
[im 17/42]
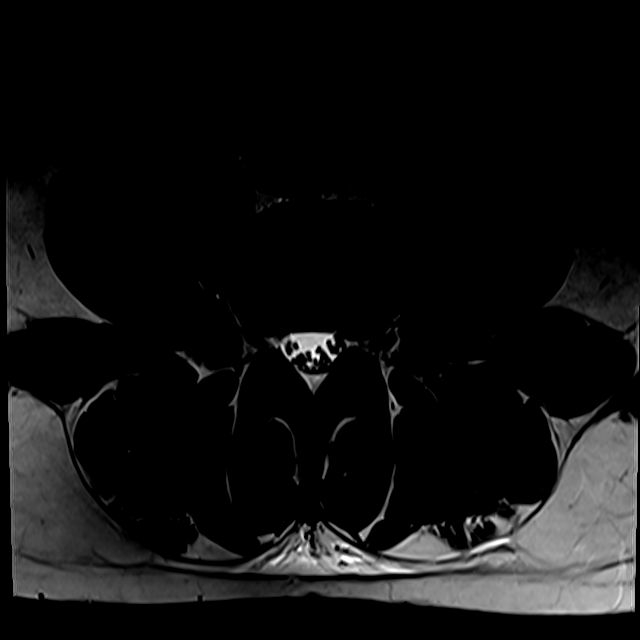
[im 22/42]
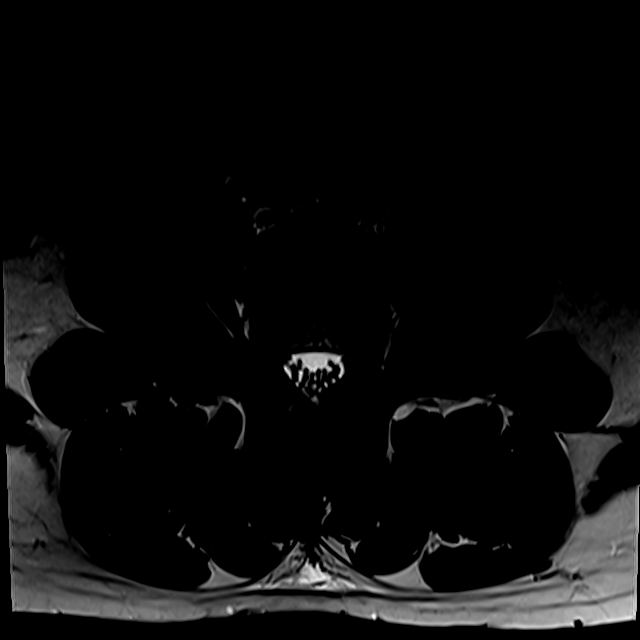
[im 37/42]
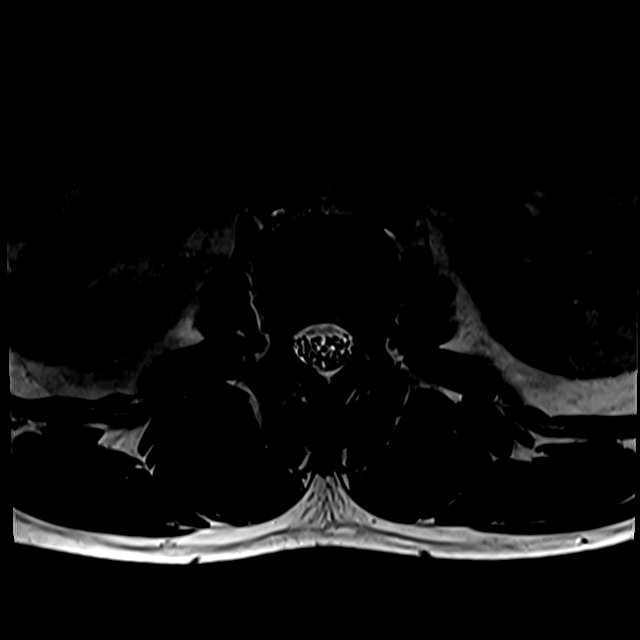

[18 of 48 positions shown; findings below may reference images not displayed]

FINDINGS: Segmentation: Standard. Lowest well-formed disc space labeled the
L5-S1 level.

Alignment: Physiologic with preservation of the normal lumbar
lordosis. No listhesis.

Vertebrae: Vertebral body height well maintained without evidence
for acute or chronic fracture. Bone marrow signal intensity within
normal limits. No discrete or worrisome osseous lesions. No abnormal
marrow edema.

Conus medullaris and cauda equina: Conus extends to the L1 level.
Conus and cauda equina appear normal.

Paraspinal and other soft tissues: Paraspinous soft tissues within
normal limits. Visualized visceral structures are normal.

Disc levels:

No significant findings are seen through the L4-5 level.

L5-S1: Normal interspace. Mild to moderate right-sided facet
hypertrophy. There is a 5 mm focus of T1/T2 hypointense signal
intensity seen at the anteromedial aspect of the right L5-S1 facet,
contacting and slightly deforming the adjacent descending right S1
nerve root sheath (series 13, image 39). Finding is indeterminate,
but could reflect a small complex synovial cyst or focal ligamentum
flavum hypertrophy. Small sequestered disc fragment could also be
considered, although the adjacent parent L5-S1 interspace is
relatively normal in appearance. Finding could affect the right S1
nerve root and resultant right lower extremity radicular symptoms.
Mild epidural lipomatosis with compression of the distal thecal sac
noted at this level. Foramina remain patent.
IMPRESSION: 1. Mild to moderate right-sided facet hypertrophy at L5-S1. 5 mm
focus of T1/T2 hypointense signal intensity at the anteromedial
aspect of the right L5-S1 facet contacts the adjacent descending
right S1 nerve root. Finding is indeterminate, but could reflect a
small complex synovial cyst or focal ligamentum flavum hypertrophy.
Small sequestered disc fragment could also be considered, although
the adjacent parent L5-S1 interspace is relatively normal in
appearance. Finding suspected to reflect the source of patient's
right lower extremity radicular symptoms.
2. Otherwise essentially normal MRI of the lumbar spine.

## 2021-05-13 ENCOUNTER — Other Ambulatory Visit: Payer: Self-pay

## 2021-05-13 ENCOUNTER — Encounter (HOSPITAL_BASED_OUTPATIENT_CLINIC_OR_DEPARTMENT_OTHER): Payer: Self-pay | Admitting: Orthopedic Surgery

## 2021-05-13 ENCOUNTER — Other Ambulatory Visit: Payer: Self-pay | Admitting: Orthopedic Surgery

## 2021-05-18 NOTE — Progress Notes (Signed)
? ? ? ? ? ?  Patient Instructions ? ?The night before surgery:  ?No food after midnight. ONLY clear liquids after midnight ? ?The day of surgery (if you do NOT have diabetes):  ?Drink ONE (1) Pre-Surgery Clear Ensure as directed.   ?This drink was given to you during your hospital  ?pre-op appointment visit. ?The pre-op nurse will instruct you on the time to drink the  ?Pre-Surgery Ensure depending on your surgery time. ?Finish the drink at the designated time by the pre-op nurse.  ?Nothing else to drink after completing the  ?Pre-Surgery Clear Ensure. ? ?The day of surgery (if you have diabetes): ?Drink ONE (1) Gatorade 2 (G2) as directed. ?This drink was given to you during your hospital  ?pre-op appointment visit.  ?The pre-op nurse will instruct you on the time to drink the  ? Gatorade 2 (G2) depending on your surgery time. ?Color of the Gatorade may vary. Red is not allowed. ?Nothing else to drink after completing the  ?Gatorade 2 (G2). ? ?       If you have questions, please contact your surgeon?s office.  ? ?Patient given presurgical soap. Education provided and patient verbalized understanding. ?

## 2021-05-20 ENCOUNTER — Encounter (HOSPITAL_BASED_OUTPATIENT_CLINIC_OR_DEPARTMENT_OTHER)
Admission: RE | Disposition: A | Discharge status: Home / Self Care | Payer: Self-pay | Source: Home / Self Care | Attending: Orthopedic Surgery

## 2021-05-20 ENCOUNTER — Ambulatory Visit (HOSPITAL_BASED_OUTPATIENT_CLINIC_OR_DEPARTMENT_OTHER): Payer: 59 | Admitting: Anesthesiology

## 2021-05-20 ENCOUNTER — Other Ambulatory Visit: Payer: Self-pay

## 2021-05-20 ENCOUNTER — Ambulatory Visit (HOSPITAL_BASED_OUTPATIENT_CLINIC_OR_DEPARTMENT_OTHER)
Admission: RE | Admit: 2021-05-20 | Discharge: 2021-05-20 | Disposition: A | Discharge status: Home / Self Care | Payer: 59 | Attending: Orthopedic Surgery | Admitting: Orthopedic Surgery

## 2021-05-20 ENCOUNTER — Encounter (HOSPITAL_BASED_OUTPATIENT_CLINIC_OR_DEPARTMENT_OTHER): Payer: Self-pay | Admitting: Orthopedic Surgery

## 2021-05-20 DIAGNOSIS — S83231A Complex tear of medial meniscus, current injury, right knee, initial encounter: Secondary | ICD-10-CM

## 2021-05-20 DIAGNOSIS — X58XXXA Exposure to other specified factors, initial encounter: Secondary | ICD-10-CM | POA: Insufficient documentation

## 2021-05-20 DIAGNOSIS — M869 Osteomyelitis, unspecified: Secondary | ICD-10-CM

## 2021-05-20 HISTORY — PX: KNEE ARTHROSCOPY: SHX127

## 2021-05-20 SURGERY — ARTHROSCOPY, KNEE
Anesthesia: General | Site: Knee | Laterality: Right

## 2021-05-20 MED ORDER — MIDAZOLAM HCL 5 MG/5ML IJ SOLN
INTRAMUSCULAR | Status: DC | PRN
  Administered 2021-05-20: 2 mg via INTRAVENOUS

## 2021-05-20 MED ORDER — LACTATED RINGERS IV SOLN: INTRAVENOUS | Status: DC

## 2021-05-20 MED ORDER — KETOROLAC TROMETHAMINE 30 MG/ML IJ SOLN
INTRAMUSCULAR | Status: AC
  Filled 2021-05-20: qty 1

## 2021-05-20 MED ORDER — FENTANYL CITRATE (PF) 100 MCG/2ML IJ SOLN
INTRAMUSCULAR | Status: DC | PRN
  Administered 2021-05-20: 50 ug via INTRAVENOUS

## 2021-05-20 MED ORDER — FENTANYL CITRATE (PF) 100 MCG/2ML IJ SOLN
INTRAMUSCULAR | Status: AC
  Filled 2021-05-20: qty 2

## 2021-05-20 MED ORDER — LIDOCAINE 2% (20 MG/ML) 5 ML SYRINGE
INTRAMUSCULAR | Status: DC | PRN
  Administered 2021-05-20: 60 mg via INTRAVENOUS

## 2021-05-20 MED ORDER — ACETAMINOPHEN 500 MG PO TABS
1000.0000 mg | ORAL_TABLET | Freq: Once | ORAL | Status: AC
  Administered 2021-05-20: 1000 mg via ORAL

## 2021-05-20 MED ORDER — FENTANYL CITRATE (PF) 100 MCG/2ML IJ SOLN
INTRAMUSCULAR | Status: AC
Start: 2021-05-20 — End: ?
  Filled 2021-05-20: qty 2

## 2021-05-20 MED ORDER — OXYCODONE HCL 5 MG PO TABS: 5.0000 mg | ORAL_TABLET | Freq: Four times a day (QID) | ORAL | 0 refills | Status: AC | PRN

## 2021-05-20 MED ORDER — ONDANSETRON HCL 4 MG/2ML IJ SOLN
INTRAMUSCULAR | Status: DC | PRN
  Administered 2021-05-20: 4 mg via INTRAVENOUS

## 2021-05-20 MED ORDER — ONDANSETRON HCL 4 MG/2ML IJ SOLN
INTRAMUSCULAR | Status: AC
  Filled 2021-05-20: qty 2

## 2021-05-20 MED ORDER — CELECOXIB 200 MG PO CAPS
ORAL_CAPSULE | ORAL | Status: AC
  Filled 2021-05-20: qty 1

## 2021-05-20 MED ORDER — DEXAMETHASONE SODIUM PHOSPHATE 10 MG/ML IJ SOLN
INTRAMUSCULAR | Status: DC | PRN
  Administered 2021-05-20: 10 mg via INTRAVENOUS

## 2021-05-20 MED ORDER — CELECOXIB 200 MG PO CAPS
200.0000 mg | ORAL_CAPSULE | Freq: Once | ORAL | Status: AC
  Administered 2021-05-20: 200 mg via ORAL

## 2021-05-20 MED ORDER — PROPOFOL 10 MG/ML IV BOLUS
INTRAVENOUS | Status: DC | PRN
  Administered 2021-05-20: 50 mg via INTRAVENOUS
  Administered 2021-05-20: 200 mg via INTRAVENOUS

## 2021-05-20 MED ORDER — ONDANSETRON HCL 4 MG/2ML IJ SOLN: 4.0000 mg | Freq: Once | INTRAMUSCULAR | Status: DC | PRN

## 2021-05-20 MED ORDER — CEFAZOLIN SODIUM-DEXTROSE 2-4 GM/100ML-% IV SOLN
INTRAVENOUS | Status: AC
  Filled 2021-05-20: qty 100

## 2021-05-20 MED ORDER — CEFAZOLIN SODIUM-DEXTROSE 2-4 GM/100ML-% IV SOLN
2.0000 g | INTRAVENOUS | Status: AC
  Administered 2021-05-20: 2 g via INTRAVENOUS

## 2021-05-20 MED ORDER — OXYCODONE HCL 5 MG PO TABS
5.0000 mg | ORAL_TABLET | Freq: Once | ORAL | Status: AC | PRN
  Administered 2021-05-20: 5 mg via ORAL

## 2021-05-20 MED ORDER — ACETAMINOPHEN 500 MG PO TABS
ORAL_TABLET | ORAL | Status: AC
  Filled 2021-05-20: qty 2

## 2021-05-20 MED ORDER — FENTANYL CITRATE (PF) 100 MCG/2ML IJ SOLN
25.0000 ug | INTRAMUSCULAR | Status: DC | PRN
  Administered 2021-05-20 (×2): 50 ug via INTRAVENOUS

## 2021-05-20 MED ORDER — OXYCODONE HCL 5 MG/5ML PO SOLN: 5.0000 mg | Freq: Once | ORAL | Status: AC | PRN

## 2021-05-20 MED ORDER — DEXAMETHASONE SODIUM PHOSPHATE 10 MG/ML IJ SOLN
INTRAMUSCULAR | Status: AC
  Filled 2021-05-20: qty 1

## 2021-05-20 MED ORDER — PROMETHAZINE HCL 12.5 MG PO TABS: 12.5000 mg | ORAL_TABLET | Freq: Four times a day (QID) | ORAL | 0 refills | Status: AC | PRN

## 2021-05-20 MED ORDER — PROPOFOL 10 MG/ML IV BOLUS
INTRAVENOUS | Status: AC
  Filled 2021-05-20: qty 20

## 2021-05-20 MED ORDER — SODIUM CHLORIDE 0.9 % IR SOLN
Status: DC | PRN
  Administered 2021-05-20: 6000 mL

## 2021-05-20 MED ORDER — MIDAZOLAM HCL 2 MG/2ML IJ SOLN
INTRAMUSCULAR | Status: AC
  Filled 2021-05-20: qty 2

## 2021-05-20 MED ORDER — OXYCODONE HCL 5 MG PO TABS
ORAL_TABLET | ORAL | Status: AC
  Filled 2021-05-20: qty 1

## 2021-05-20 MED ORDER — LIDOCAINE 2% (20 MG/ML) 5 ML SYRINGE
INTRAMUSCULAR | Status: AC
  Filled 2021-05-20: qty 5

## 2021-05-20 MED ORDER — ROPIVACAINE HCL 5 MG/ML IJ SOLN
INTRAMUSCULAR | Status: DC | PRN
  Administered 2021-05-20: 30 mL

## 2021-05-20 SURGICAL SUPPLY — 56 items
APL PRP STRL LF DISP 70% ISPRP (MISCELLANEOUS) ×1
BANDAGE ESMARK 6X9 LF (GAUZE/BANDAGES/DRESSINGS) ×2 IMPLANT
BLADE CLIPPER SURG (BLADE) IMPLANT
BLADE EXCALIBUR 4.0X13 (MISCELLANEOUS) IMPLANT
BLADE SHAVER BONE 5.0X13 (MISCELLANEOUS) IMPLANT
BLADE SURG 15 STRL LF DISP TIS (BLADE) IMPLANT
BLADE SURG 15 STRL SS (BLADE)
BNDG CMPR 9X6 STRL LF SNTH (GAUZE/BANDAGES/DRESSINGS) ×1
BNDG COHESIVE 4X5 TAN ST LF (GAUZE/BANDAGES/DRESSINGS) ×3 IMPLANT
BNDG ELASTIC 4X5.8 VLCR STR LF (GAUZE/BANDAGES/DRESSINGS) ×1 IMPLANT
BNDG ELASTIC 6X5.8 VLCR STR LF (GAUZE/BANDAGES/DRESSINGS) ×3 IMPLANT
BNDG ESMARK 6X9 LF (GAUZE/BANDAGES/DRESSINGS) ×2
BURR OVAL 8 FLU 4.0X13 (MISCELLANEOUS) IMPLANT
CHLORAPREP W/TINT 26 (MISCELLANEOUS) ×3 IMPLANT
CUFF TOURN SGL QUICK 34 (TOURNIQUET CUFF) ×2
CUFF TRNQT CYL 34X4.125X (TOURNIQUET CUFF) ×2 IMPLANT
CUTTER BONE 4.0MM X 13CM (MISCELLANEOUS) IMPLANT
CUTTER TENSIONER SUT 2-0 0 FBW (INSTRUMENTS) IMPLANT
DISSECTOR 3.5MM X 13CM CVD (MISCELLANEOUS) IMPLANT
DISSECTOR 4.0MMX13CM CVD (MISCELLANEOUS) ×3 IMPLANT
DRAPE EXTREMITY T 121X128X90 (DISPOSABLE) ×3 IMPLANT
DRAPE IMP U-DRAPE 54X76 (DRAPES) IMPLANT
DRAPE INCISE IOBAN 66X45 STRL (DRAPES) ×3 IMPLANT
DRAPE U-SHAPE 47X51 STRL (DRAPES) ×3 IMPLANT
DW OUTFLOW CASSETTE/TUBE SET (MISCELLANEOUS) ×3 IMPLANT
ELECT REM PT RETURN 9FT ADLT (ELECTROSURGICAL) ×2
ELECTRODE REM PT RTRN 9FT ADLT (ELECTROSURGICAL) ×2 IMPLANT
FIBERSTICK 2 (SUTURE) IMPLANT
GAUZE SPONGE 4X4 12PLY STRL (GAUZE/BANDAGES/DRESSINGS) ×1 IMPLANT
GLOVE BIOGEL PI IND STRL 8 (GLOVE) ×2 IMPLANT
GLOVE BIOGEL PI INDICATOR 8 (GLOVE) ×1
GLOVE ORTHO TXT STRL SZ7.5 (GLOVE) ×6 IMPLANT
GOWN STRL REUS W/ TWL LRG LVL3 (GOWN DISPOSABLE) ×2 IMPLANT
GOWN STRL REUS W/TWL LRG LVL3 (GOWN DISPOSABLE) ×2
GOWN STRL REUS W/TWL XL LVL3 (GOWN DISPOSABLE) ×3 IMPLANT
KIT TURNOVER KIT B (KITS) ×3 IMPLANT
MANIFOLD NEPTUNE II (INSTRUMENTS) ×3 IMPLANT
NDL SAFETY ECLIPSE 18X1.5 (NEEDLE) ×2 IMPLANT
NEEDLE HYPO 18GX1.5 SHARP (NEEDLE) ×2
NS IRRIG 1000ML POUR BTL (IV SOLUTION) IMPLANT
PACK ARTHROSCOPY DSU (CUSTOM PROCEDURE TRAY) ×3 IMPLANT
PADDING CAST COTTON 6X4 STRL (CAST SUPPLIES) ×1 IMPLANT
PENCIL SMOKE EVACUATOR (MISCELLANEOUS) IMPLANT
PORT APPOLLO RF 90DEGREE MULTI (SURGICAL WAND) IMPLANT
SHEET MEDIUM DRAPE 40X70 STRL (DRAPES) IMPLANT
SLEEVE SCD COMPRESS KNEE MED (STOCKING) ×3 IMPLANT
SPONGE T-LAP 18X18 ~~LOC~~+RFID (SPONGE) IMPLANT
STRIP CLOSURE SKIN 1/2X4 (GAUZE/BANDAGES/DRESSINGS) ×3 IMPLANT
SUT MNCRL AB 3-0 PS2 27 (SUTURE) ×3 IMPLANT
SUT MON AB 2-0 CT1 36 (SUTURE) ×3 IMPLANT
SUT PDS AB 1 CT  36 (SUTURE)
SUT PDS AB 1 CT 36 (SUTURE) IMPLANT
SYR 5ML LL (SYRINGE) ×3 IMPLANT
TOWEL GREEN STERILE FF (TOWEL DISPOSABLE) ×6 IMPLANT
TUBE CONNECTING 20X1/4 (TUBING) IMPLANT
TUBING ARTHROSCOPY IRRIG 16FT (MISCELLANEOUS) ×3 IMPLANT

## 2021-05-20 NOTE — Anesthesia Postprocedure Evaluation (Signed)
Anesthesia Post Note  Patient: Jeremy Hamilton  Procedure(s) Performed: RIGHT KNEE PARTIAL MEDIAL MENISCECTOMY (Right: Knee)     Patient location during evaluation: PACU Anesthesia Type: General Level of consciousness: awake and alert Pain management: pain level controlled Vital Signs Assessment: post-procedure vital signs reviewed and stable Respiratory status: spontaneous breathing, nonlabored ventilation and respiratory function stable Cardiovascular status: stable Anesthetic complications: no   No notable events documented.  Last Vitals:  Vitals:   05/20/21 1245 05/20/21 1255  BP: (!) 174/91 (!) 171/99  Pulse:    Resp:    Temp:    SpO2:      Last Pain:  Vitals:   05/20/21 1234  TempSrc: Oral  PainSc: 6                  [name]

## 2021-05-20 NOTE — Transfer of Care (Signed)
Immediate Anesthesia Transfer of Care Note  Patient: Jeremy Hamilton  Procedure(s) Performed: RIGHT KNEE PARTIAL MEDIAL MENISCECTOMY (Right: Knee)  Patient Location: PACU  Anesthesia Type:General  Level of Consciousness: awake and drowsy  Airway & Oxygen Therapy: Patient Spontanous Breathing and Patient connected to face mask oxygen  Post-op Assessment: Report given to RN and Post -op Vital signs reviewed and stable  Post vital signs: Reviewed and stable  Last Vitals:  Vitals Value Taken Time  BP 151/88 05/20/21 1145  Temp    Pulse 64 05/20/21 1148  Resp 18 05/20/21 1148  SpO2 100 % 05/20/21 1148  Vitals shown include unvalidated device data.  Last Pain:  Vitals:   05/20/21 0953  TempSrc: Oral  PainSc: 8       Patients Stated Pain Goal: 5 [phone])  Complications: No notable events documented.

## 2021-05-20 NOTE — Discharge Instructions (Addendum)
Discharge instructions for Dr. Ernestina Columbia, M.D.: Please refer to the two-sided discharge instructions paper that Dr. Sherilyn Dacosta placed in the patient's paper chart. Please give this to the patient to take home after reviewing with the patient!!   General discharge instructions:  PLEASE REFER TO TWO-SIDED PAPER INSTRUCTIONS IN PAPER CHART FOR SPECIFIC INSTRUCTIONS!!!  Diet: As you were doing prior to hospitalization. Shower:  Unless otherwise specified (i.e. on two-sided paper instructions with paper chart) may shower but keep the wounds dry, use an occlusive plastic wrap, NO SOAKING IN TUB.  If the bandage gets wet, change with a clean dry gauze. Dressing:  Unless otherwise specified (i.e. on two-sided paper instructions with paper chart), may change your dressing 3-5 days after surgery.  Then change the dressing daily with sterile gauze dressing.  If there are sticky tapes (steri-strips) on your wounds and all the stitches are absorbable.  Leave the steri-strips in place when changing your dressings, they will peel off with time, usually 2-3 weeks. Activity:  Increase activity slowly as tolerated, but follow the restrictions on the two-sided paper discharge instructions sheet that Dr. Sherilyn Dacosta placed in the paper chart.  No lifting or driving for 6 weeks. Weight Bearing: WEIGHTBEARING AS TOLERATED (WBAT) on the RIGHT LOWER EXTREMITY. To prevent constipation: You may use over-the-counter stool softener(s) such as Colace (over the counter) 100 mg by mouth twice a day and/or Miralax (over the counter) for constipation as needed.  Drink plenty of fluids (prune juice may be helpful) and high fiber foods.  Itching:  If you experience itching with your medications, try taking only a single pain pill, or even half a pain pill at a time.  You can also use benadryl over the counter for itching or also to help with sleep.  Precautions:  If you experience chest pain or shortness of breath - call 911 immediately for  transfer to the hospital emergency department!!  PLEASE REFER TO TWO-SIDED PAPER INSTRUCTIONS IN PAPER CHART FOR SPECIFIC INSTRUCTIONS!!!  If you develop a fever greater that 101.1 deg F, purulent drainage from wound, increased redness or drainage from wound, or calf pain -- Call the office at [phone].   Post Anesthesia Home Care Instructions  Activity: Get plenty of rest for the remainder of the day. A responsible individual must stay with you for 24 hours following the procedure.  For the next 24 hours, DO NOT: -Drive a car -Advertising copywriter -Drink alcoholic beverages -Take any medication unless instructed by your physician -Make any legal decisions or sign important papers.  Meals: Start with liquid foods such as gelatin or soup. Progress to regular foods as tolerated. Avoid greasy, spicy, heavy foods. If nausea and/or vomiting occur, drink only clear liquids until the nausea and/or vomiting subsides. Call your physician if vomiting continues.  Special Instructions/Symptoms: Your throat may feel dry or sore from the anesthesia or the breathing tube placed in your throat during surgery. If this causes discomfort, gargle with warm salt water. The discomfort should disappear within 24 hours.  If you had a scopolamine patch placed behind your ear for the management of post- operative nausea and/or vomiting:  1. The medication in the patch is effective for 72 hours, after which it should be removed.  Wrap patch in a tissue and discard in the trash. Wash hands thoroughly with soap and water. 2. You may remove the patch earlier than 72 hours if you experience unpleasant side effects which may include dry mouth, dizziness or visual disturbances. 3.  Avoid touching the patch. Wash your hands with soap and water after contact with the patch.  *May have Tylenol/Ibuprofen today at 4pm on 05/20/21

## 2021-05-20 NOTE — H&P (Signed)
PREOPERATIVE H&P  HPI: Jeremy Hamilton is a 36 y.o. male who has presented today for surgery, with the diagnosis of medial meniscus tear.  He attempted extensive conservative treatment, including extensive physical therapy, injections, anti-inflammatory medications, but experience persistence of pain and symptoms along his medial joint line.  This localized to the place where a medial meniscus tear including a comma sign fragment was identified on MRI.  He did experience ongoing mechanical symptoms including snapping, clicking, and catching, also localizing to the area of the meniscal tear.  The symptoms and pain prevent him from doing exercises, and significantly inhibit his ability to play soccer.  Given his lack of improvement with conservative treatment, he did wish to proceed with more definitive treatment.  The various methods of treatment have been discussed with the patient and family.  After consideration of risks, benefits, and other options for treatment, the patient has consented to RIGHT KNEE PARTIAL MEDIAL MENISCECTOMY as a surgical intervention.  We also discussed that if under arthroscopic evaluation it is deemed that the tear is irreparable, and that that is the best thing for him in his knee, and repair will be performed, but that this is exceedingly unlikely given the appearance on MRI.  The patient's history has been reviewed, patient examined, no change in status, stable for surgery.  I have reviewed the patient's chart and labs.  Questions were answered to the patient's satisfaction.    PMH: History reviewed. No pertinent past medical history.  Home Medications Allergies  No current facility-administered medications on file prior to encounter.   Current Outpatient Medications on File Prior to Encounter  Medication Sig Dispense Refill   acetaminophen (TYLENOL) 500 MG tablet Take 500 mg by mouth every 6 (six) hours as needed.     ibuprofen (ADVIL,MOTRIN) 200 MG tablet Take 800 mg by  mouth daily as needed for mild pain or moderate pain.     MELOXICAM PO Take by mouth.     traMADol (ULTRAM) 50 MG tablet Take 50 mg by mouth every 6 (six) hours as needed.     No Known Allergies   PSH: Past Surgical History:  Procedure Laterality Date   ANKLE ARTHROSCOPY WITH RECONSTRUCTION Left 02/26/2020   Procedure: LEFT ANKLE ARTHROSCOPIC DEBRIDEMENT, ARTHROSCOPIC TREATMENT OF TALUS OSTEOCHONDRAL LESION, LATERAL LIGAMENT RECONSTRUCTION, PERONEAL TENDON DEBRIDEMENT, REPAIR DISLOCATED PERONEAL TENDONS WITH FIBULA DEEPENING;  Surgeon: Terance Hart, MD;  Location: [facility];  Service: Orthopedics;  Laterality: Left;  LENGTH OF SURGERY: 2HOURS   SYNOVIAL CYST EXCISION       Family History Social History  History reviewed. No pertinent family history.  Social History   Socioeconomic History   Marital status: Single    Spouse name: Not on file   Number of children: Not on file   Years of education: Not on file   Highest education level: Not on file  Occupational History   Not on file  Tobacco Use   Smoking status: Never   Smokeless tobacco: Never  Substance and Sexual Activity   Alcohol use: No   Drug use: No   Sexual activity: Not on file  Other Topics Concern   Not on file  Social History Narrative   Not on file   Social Determinants of Health   Financial Resource Strain: Not on file  Food Insecurity: Not on file  Transportation Needs: Not on file  Physical Activity: Not on file  Stress: Not on file  Social Connections: Not on file  Review of Systems: MSK: As noted per HPI above GI: No current Nausea/vomiting ENT: Denies sore throat, epistaxis CV: Denies chest pain Resp: No current shortness of breath  Other than mentioned above, there are no Constitutional, Neurological, Psychiatric, ENT, Ophthalmological, Cardiovascular, Respiratory, GI, GU, Musculoskeletal, Integumentary, Lymphatic, Endocrine or Allergic issues.   Physical  Examination: CV: Normal distal pulses Lungs: Unlabored respirations RLE: Examination of the right knee demonstrates mild effusion.  Range of motion 0 degrees to 130 degrees, pain with increased flexion.  There is localized tenderness over the medial joint line, corresponding to area of comma sign fragment identified on MRI.  There is a positive medial McMurray's, and pain with hyperflexion/Steinmann test.  Stable Lachman.  Stable anterior drawer and posterior drawer.  Stable to varus and valgus stress in 30 degrees and 0 degrees.  Normal dorsiflexion, plantarflexion, and EHL strength and function.  Sensation tact light touch in the superficial peroneal, deep peroneal, and tibial distributions.  Normal DP and PT pulses.  Warm and well-perfused distally.  Assessment/Plan: RIGHT KNEE PARTIAL MEDIAL MENISCECTOMY    Ernestina Columbia M.D. Orthopaedic Surgery [facility]  Review of this patient's medications prescribed by other providers does not in any way constitute an endorsement by this clinician of their use, indications, dosage, route, efficacy, interactions, or other clinical parameters.  Portions of the record have been created with voice recognition software.  Grammatical and punctuation errors, random word insertions, wrong-word or "sound-a-like" substitutions, pronoun errors (inaccuracies and/or substitutions), and/or incomplete sentences may have occurred due to the inherent limitations of voice recognition software.  Not all errors are caught or corrected.  Although every attempt is made to root out erroneous and incomplete transcription, the note may still not fully represent the intent or opinion of the author.  Read the chart carefully and recognize, using context, where errors/substitutions have occurred.  Any questions or concerns about the content of this note or information contained within the body of this dictation should be addressed directly with the author  for clarification.

## 2021-05-20 NOTE — Op Note (Signed)
OPERATIVE NOTE  Rendon A Haddon male 36 y.o. 05/20/2021  PREOPERATIVE DIAGNOSIS: Right complex medial meniscus tear, due to current injury  POSTOPERATIVE DIAGNOSIS: Right complex medial meniscus tear, due to current injury [id number]  PROCEDURE(S): Right knee arthroscopy with partial medial meniscectomy [id number]  SURGEON: Georgeanna Harrison, M.D.  ASSISTANT(S): Wandra Arthurs, RNFA  ANESTHESIA: General  FINDINGS: Preoperative Examination: RLE: Examination of the right knee demonstrates mild effusion.  Range of motion 0 degrees to 130 degrees, pain with increased flexion.  There is localized tenderness over the medial joint line, corresponding to area of comma sign fragment identified on MRI.  There is a positive medial McMurray's, and pain with hyperflexion/Steinmann test.  Stable Lachman.  Stable anterior drawer and posterior drawer.  Stable to varus and valgus stress in 30 degrees and 0 degrees.  Normal dorsiflexion, plantarflexion, and EHL strength and function.  Sensation tact light touch in the superficial peroneal, deep peroneal, and tibial distributions.  Normal DP and PT pulses.  Warm and well-perfused distally.  Operative Findings: Arthroscopic examination of the knee revealed the following: Medial compartment: Complex tearing of the posterior horn of the medial meniscus, with a flipping, sign fragment at the posterior horn-body junction, easily displaced into the joint and meniscotibial recess upon probing.  Focal areas of grade 2 changes on the medial aspect of the medial tibial plateau.  Focal areas of grade 1 and 2 changes on the medial aspect of the medial femoral condyle. Intercondylar notch: ACL intact.  PCL intact. Lateral compartment: Lateral meniscus intact and stable to probing.  Lateral tibial plateau cartilage intact.  Lateral femoral condyle cartilage intact. Lateral gutter: No synovitis.  No loose bodies. Patellofemoral compartment: Patellar cartilage intact.  Focal area  of grade 2 change on the medial trochlear facet, but otherwise trochlear cartilage intact. Suprapatellar pouch: No synovitis.  No loose bodies. Medial gutter: No synovitis.  No loose bodies.  IMPLANTS: * No implants in log *  INDICATIONS:  The patient is a 36 y.o. male with a longstanding history of medial knee pain and symptoms after an injury.  He was treated for quite some time by my partner nonoperatively, and subsequently by me nonoperatively, with extensive physical therapy, anti-inflammatory medications, and injections, including both steroid and Toradol injections intra-articularly.  Unfortunately he experienced no lasting relief with conservative treatment, and ultimately experience persistence of medial pain and mechanical symptoms localizing to the area where an MRI had demonstrated a complex medial meniscus tear with a flipped, sign fragment in the meniscotibial recess.  Given the persistence of his pain and symptoms with conservative treatment, and his difficulty returning to soccer and exercise, he was interested in pursuing more definitive treatment.  We therefore discussed the possibility of arthroscopic evaluation and partial medial meniscectomy.  He understood the risks, benefits and alternatives to surgery which include but are not limited to bleeding, wound healing complications, infection, damage to surrounding structures, persistent pain, stiffness, lack of improvement, potential for subsequent arthritis or worsening of pre-existing arthritis, and need for further surgery, as well as complications related to anesthesia, cardiovascular complications, and death.  He also understood the potential for continued pain, and that there were no guarantees of acceptable outcome.  After weighing these risks the patient opted to proceed with surgery.  TECHNIQUE: Patient was identified in the preoperative holding area.  The right knee was marked by myself.  Consent was signed by myself and the  patient.  No block was performed by anesthesia in the preoperative holding area.  Patient was taken to the operative suite and placed supine on the operative table.  Anesthesia was induced by the anesthesia team.  The patient was positioned appropriately for the procedure and all bony prominences were well padded.  A non-sterile thigh tourniquet was placed on the operative extremity.  Preoperative antibiotics were given. The extremity was prepped and draped in the usual sterile fashion and surgical timeout was performed.  Tourniquet was not inflated during this procedure.  Surface anatomy was marked out on the skin.  Lateral infrapatellar portal was established.  Medial infrapatellar portal was established under arthroscopic visualization using spinal needle localization, and diagnostic arthroscopy was performed with findings as noted.  The morphology of the medial meniscus tear corresponded well to the comma sign meniscotibial recess fragment which had been observed on the MRI.  This tear was not amenable to repair, so I proceeded with a partial meniscectomy.  Partial medial meniscectomy was performed using combination of arthroscopic biters and the arthroscopic sucker shaver.  The medial meniscus was debrided back to stable, healthy appearing tissue.  The meniscotibial recess fragment was carefully probed and debrided, and the meniscus was evaluated from both portals in order to confirm that the unstable flap components had been appropriately debrided.  The knee was thoroughly lavaged and suctioned of all fluid and debris.  Portals were closed with simple inverted 3-0 Monocryl, and the skin was sealed with Steri-Strips.  A dry sterile dressing consisting of 4 x 4's, ABD, Webril, and an Ace wrap was placed on the knee, followed by an ice pack.  The patient was awakened from anesthesia and transferred to PACU in stable condition.  He tolerated the procedure well.  There were no complications.  POST OPERATIVE  INSTRUCTIONS: Mobility: Crutches as needed for 24 to 48 hours, may discontinue as able. Pain control: Continue to wean/titrate to appropriate oral regimen DVT Prophylaxis: 81 mg aspirin twice daily x2 weeks RLE: Weightbearing as tolerated Disposition: Home Dressing care: Remove brace (if present), Ace wrap (if present), and gauze/tape dressing 48 hours after surgery.  Do not remove paper tapes (Steri-Strips) or any suture material.  Steri-Strips may eventually fall off on their own.  Reapply fresh gauze/tape.  Reapply Ace wrap (if present), wrapping lightly over wounds.  Reapply brace if present. Follow-up: Please call [facility] [phone]) to schedule follow-op appointment for 2 weeks after surgery.  TOURNIQUET TIME: N/A  BLOOD LOSS: 15 mL         DRAINS: none         SPECIMEN: none       COMPLICATIONS:  * No complications entered in OR log *         DISPOSITION: PACU - hemodynamically stable.         CONDITION: stable   Georgeanna Harrison M.D. Orthopaedic Surgery [facility]   Portions of the record have been created with voice recognition software.  Grammatical and punctuation errors, random word insertions, wrong-word or "sound-a-like" substitutions, pronoun errors (inaccuracies and/or substitutions), and/or incomplete sentences may have occurred due to the inherent limitations of voice recognition software.  Not all errors are caught or corrected.  Although every attempt is made to root out erroneous and incomplete transcription, the note may still not fully represent the intent or opinion of the author.  Read the chart carefully and recognize, using context, where errors/substitutions have occurred.  Any questions or concerns about the content of this note or information contained within the body of  this dictation should be addressed directly with the author for clarification.

## 2021-05-20 NOTE — Anesthesia Preprocedure Evaluation (Addendum)
Anesthesia Evaluation  Patient identified by MRN, date of birth, ID band Patient awake    Reviewed: Allergy & Precautions, NPO status , Patient's Chart, lab work & pertinent test results  History of Anesthesia Complications Negative for: history of anesthetic complications  Airway Mallampati: I  TM Distance: >3 FB Neck ROM: Full    Dental  (+) Dental Advisory Given, Teeth Intact   Pulmonary neg pulmonary ROS,    Pulmonary exam normal        Cardiovascular negative cardio ROS Normal cardiovascular exam     Neuro/Psych negative neurological ROS  negative psych ROS   GI/Hepatic negative GI ROS, Neg liver ROS,   Endo/Other  negative endocrine ROS  Renal/GU negative Renal ROS     Musculoskeletal  (+) Arthritis ,   Abdominal   Peds  Hematology negative hematology ROS (+)   Anesthesia Other Findings   Reproductive/Obstetrics                            Anesthesia Physical Anesthesia Plan  ASA: 1  Anesthesia Plan: General   Post-op Pain Management: Tylenol PO (pre-op)* and Celebrex PO (pre-op)*   Induction: Intravenous  PONV Risk Score and Plan: 2 and Treatment may vary due to age or medical condition, Ondansetron, Dexamethasone and Midazolam  Airway Management Planned: LMA  Additional Equipment: None  Intra-op Plan:   Post-operative Plan: Extubation in OR  Informed Consent: I have reviewed the patients History and Physical, chart, labs and discussed the procedure including the risks, benefits and alternatives for the proposed anesthesia with the patient or authorized representative who has indicated his/her understanding and acceptance.     Dental advisory given  Plan Discussed with: CRNA and Anesthesiologist  Anesthesia Plan Comments:        Anesthesia Quick Evaluation

## 2021-05-20 NOTE — Anesthesia Procedure Notes (Signed)
Procedure Name: LMA Insertion Date/Time: 05/20/2021 10:55 AM Performed by: [place], Shaliyah Taite C, CRNA Pre-anesthesia Checklist: Patient identified, Emergency Drugs available, Suction available and Patient being monitored Patient Re-evaluated:Patient Re-evaluated prior to induction Oxygen Delivery Method: Circle system utilized Preoxygenation: Pre-oxygenation with 100% oxygen Induction Type: IV induction Ventilation: Mask ventilation without difficulty LMA: LMA inserted LMA Size: 4.0 Number of attempts: 1 Airway Equipment and Method: Bite block Placement Confirmation: positive ETCO2 Tube secured with: Tape Dental Injury: Teeth and Oropharynx as per pre-operative assessment

## 2021-05-22 ENCOUNTER — Encounter (HOSPITAL_BASED_OUTPATIENT_CLINIC_OR_DEPARTMENT_OTHER): Payer: Self-pay | Admitting: Orthopedic Surgery

## 2022-12-18 ENCOUNTER — Other Ambulatory Visit: Payer: Self-pay | Admitting: Gastroenterology

## 2022-12-18 DIAGNOSIS — R768 Other specified abnormal immunological findings in serum: Secondary | ICD-10-CM

## 2022-12-19 ENCOUNTER — Ambulatory Visit
Admission: RE | Admit: 2022-12-19 | Discharge: 2022-12-19 | Disposition: A | Discharge status: Home / Self Care | Payer: 59 | Source: Ambulatory Visit | Attending: Gastroenterology | Admitting: Gastroenterology

## 2022-12-19 DIAGNOSIS — R768 Other specified abnormal immunological findings in serum: Secondary | ICD-10-CM

## 2023-09-19 ENCOUNTER — Other Ambulatory Visit: Payer: Self-pay

## 2023-09-19 ENCOUNTER — Encounter (HOSPITAL_BASED_OUTPATIENT_CLINIC_OR_DEPARTMENT_OTHER): Payer: Self-pay | Admitting: Emergency Medicine

## 2023-09-19 DIAGNOSIS — K0889 Other specified disorders of teeth and supporting structures: Secondary | ICD-10-CM | POA: Diagnosis not present

## 2023-09-19 DIAGNOSIS — W500XXA Accidental hit or strike by another person, initial encounter: Secondary | ICD-10-CM | POA: Insufficient documentation

## 2023-09-19 DIAGNOSIS — Y9366 Activity, soccer: Secondary | ICD-10-CM | POA: Insufficient documentation

## 2023-09-19 DIAGNOSIS — S0993XA Unspecified injury of face, initial encounter: Secondary | ICD-10-CM | POA: Diagnosis present

## 2023-09-19 DIAGNOSIS — Z23 Encounter for immunization: Secondary | ICD-10-CM | POA: Diagnosis not present

## 2023-09-19 DIAGNOSIS — S01511A Laceration without foreign body of lip, initial encounter: Secondary | ICD-10-CM | POA: Insufficient documentation

## 2023-09-19 NOTE — ED Triage Notes (Signed)
 Elbow to mouth while playing soccer Lac to lower lip tooth went through lip Some dizziness Denies loc, denies hitting head Happened around 8pm

## 2023-09-20 ENCOUNTER — Emergency Department (HOSPITAL_BASED_OUTPATIENT_CLINIC_OR_DEPARTMENT_OTHER)
Admission: EM | Admit: 2023-09-20 | Discharge: 2023-09-20 | Disposition: A | Discharge status: Home / Self Care | Attending: Emergency Medicine | Admitting: Emergency Medicine

## 2023-09-20 DIAGNOSIS — S01511A Laceration without foreign body of lip, initial encounter: Secondary | ICD-10-CM

## 2023-09-20 MED ORDER — LIDOCAINE HCL (PF) 1 % IJ SOLN
30.0000 mL | Freq: Once | INTRAMUSCULAR | Status: AC
  Administered 2023-09-20: 30 mL
  Filled 2023-09-20: qty 30

## 2023-09-20 MED ORDER — TETANUS-DIPHTH-ACELL PERTUSSIS 5-2.5-18.5 LF-MCG/0.5 IM SUSY
0.5000 mL | PREFILLED_SYRINGE | Freq: Once | INTRAMUSCULAR | Status: AC
  Administered 2023-09-20: 0.5 mL via INTRAMUSCULAR
  Filled 2023-09-20: qty 0.5

## 2023-09-20 MED ORDER — ACETAMINOPHEN 325 MG PO TABS
650.0000 mg | ORAL_TABLET | Freq: Once | ORAL | Status: AC
Start: 2023-09-20 — End: 2023-09-20
  Administered 2023-09-20: 650 mg via ORAL
  Filled 2023-09-20: qty 2

## 2023-09-20 NOTE — Discharge Instructions (Signed)
Do not let your laceration (cut) get wet for the next 48 hours. After that you may allow soapy water to drain down the wound to clean it.  Please do not scrub.  Do not submerge the wound under water for the next 2 weeks.  To minimize scarring, you can apply a vaseline based ointment for the next 2 weeks and keep it out of direct sun light. After that, you may apply sunscreen for the next several months.  Your stitches will dissolve on their own.   Return if your wound appears to be infected (see laceration care instructions).

## 2023-09-20 NOTE — ED Provider Notes (Signed)
 [place] EMERGENCY DEPARTMENT AT [facility] Provider Note  CSN: [id number] Arrival date & time: 09/19/23 2014  Chief Complaint(s) Lip Laceration  HPI Jeremy Hamilton is a 38 y.o. male with no pertinent past medical history here for laceration to left lower lip.  Reports he was playing in soccer when he was elbowed in the face by another player.  He believes his tooth went through.  Endorses mild headache.  Denies any tooth pain or jaw pain.  He is not up-to-date on tetanus vaccination.  No other injuries from the incident.  HPI  Past Medical History History reviewed. No pertinent past medical history. 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 Authorizing Provider  acetaminophen  (TYLENOL ) 500 MG tablet Take 500 mg by mouth every 6 (six) hours as needed.    [provider]  ibuprofen (ADVIL,MOTRIN) 200 MG tablet Take 800 mg by mouth daily as needed for mild pain or moderate pain.    [provider]  MELOXICAM PO Take by mouth.    [provider]  oxyCODONE  (ROXICODONE ) 5 MG immediate release tablet Take 1 tablet (5 mg total) by mouth every 6 (six) hours as needed for severe pain or breakthrough pain (postoperative pain). 05/20/21   Doll Skates, MD  promethazine  (PHENERGAN ) 12.5 MG tablet Take 1 tablet (12.5 mg total) by mouth every 6 (six) hours as needed for nausea or vomiting. 05/20/21   Doll Skates, MD                                                                                                                                    Allergies Patient has no known allergies.  Review of Systems Review of Systems As noted in HPI  Physical Exam Vital Signs  I have reviewed the triage vital signs BP 127/82   Pulse (!) 52   Temp 98.1 F (36.7 C)   Resp 16   SpO2 100%   Physical Exam Vitals  reviewed.  Constitutional:      General: He is not in acute distress.    Appearance: He is well-developed. He is not diaphoretic.  HENT:     Head: Normocephalic and atraumatic.     Right Ear: External ear normal.     Left Ear: External ear normal.     Nose: Nose normal.     Mouth/Throat:     Mouth: Mucous membranes are moist.     Dentition: Dental tenderness (mild. no subluxation) present.   Eyes:     General: No scleral icterus.    Conjunctiva/sclera: Conjunctivae normal.  Neck:     Trachea: Phonation normal.  Cardiovascular:     Rate and Rhythm: Normal rate and regular rhythm.  Pulmonary:     Effort: Pulmonary effort is normal. No respiratory distress.  Breath sounds: No stridor.  Abdominal:     General: There is no distension.  Musculoskeletal:        General: Normal range of motion.     Cervical back: Normal range of motion.  Neurological:     Mental Status: He is alert and oriented to person, place, and time.  Psychiatric:        Behavior: Behavior normal.     ED Results and Treatments Labs (all labs ordered are listed, but only abnormal results are displayed) Labs Reviewed - No data to display                                                                                                                       EKG  EKG Interpretation Date/Time:    Ventricular Rate:    PR Interval:    QRS Duration:    QT Interval:    QTC Calculation:   R Axis:      Text Interpretation:         Radiology No results found.  Medications Ordered in ED Medications  Tdap (BOOSTRIX ) injection 0.5 mL (0.5 mLs Intramuscular Given 09/20/23 0112)  acetaminophen  (TYLENOL ) tablet 650 mg (650 mg Oral Given 09/20/23 0111)  lidocaine  (PF) (XYLOCAINE ) 1 % injection 30 mL (30 mLs Other Given 09/20/23 0112)   Procedures .Laceration Repair  Date/Time: 09/20/2023 3:00 AM  Performed by: Trine Raynell Moder, MD Authorized by: Trine Raynell Moder, MD   Consent:    Consent  obtained:  Verbal   Consent given by:  Patient   Risks discussed:  Infection, pain, poor wound healing and poor cosmetic result Universal protocol:    Immediately prior to procedure, a time out was called: yes     Patient identity confirmed:  Verbally with patient and arm band Anesthesia:    Anesthesia method:  Local infiltration   Local anesthetic:  Lidocaine  1% w/o epi Laceration details:    Location:  Lip   Lip location:  Lower lip, full thickness   Vermilion border involved: no     Length (cm):  2 Pre-procedure details:    Preparation:  Patient was prepped and draped in usual sterile fashion Exploration:    Hemostasis achieved with:  Direct pressure   Wound extent: no foreign body   Treatment:    Area cleansed with:  Saline   Amount of cleaning:  Extensive   Irrigation solution:  Sterile saline   Irrigation volume:  500cc   Irrigation method:  Pressure wash   Layers repaired: lip mucosa; 4-0 vicryl rapide x1. Skin repair:    Repair method:  Sutures   Suture size:  5-0   Suture material:  Fast-absorbing gut   Suture technique:  Simple interrupted   Number of sutures:  1 Approximation:    Approximation:  Close Repair type:    Repair type:  Intermediate Post-procedure details:    Procedure completion:  Tolerated   (including critical care time) Medical Decision Making / ED Course  Medical Decision Making Risk OTC drugs. Prescription drug management.    Lower lip laceration thoroughly irrigated and closed as above.  No other injuries including dental subluxations requiring imaging at this time.  Tetanus updated.    Final Clinical Impression(s) / ED Diagnoses Final diagnoses:  Lip laceration, initial encounter   The patient appears reasonably screened and/or stabilized for discharge and I doubt any other medical condition or other [facility] requiring further screening, evaluation, or treatment in the ED at this time. I have discussed the findings, Dx and Tx plan with  the patient/family who expressed understanding and agree(s) with the plan. Discharge instructions discussed at length. The patient/family was given strict return precautions who verbalized understanding of the instructions. No further questions at time of discharge.  Disposition: Discharge  Condition: Good  ED Discharge Orders     None         Follow Up: [facility] [address] [place] [address] [phone]  Call  to schedule an appointment for close follow up    This chart was dictated using voice recognition software.  Despite best efforts to proofread,  errors can occur which can change the documentation meaning.    Trine Raynell Moder, MD 09/20/23 [phone]

## 2023-09-20 NOTE — ED Notes (Signed)
 Irrigated wound SW with jet irrigation.
